# Patient Record
Sex: Female | Born: 1990 | Race: White | Hispanic: No | Marital: Single | Smoking: Never smoker
Health system: Southern US, Community
[De-identification: ages and names within clinical notes are randomized; demographics above are authoritative.]

## PROBLEM LIST (undated history)

## (undated) DIAGNOSIS — D6851 Activated protein C resistance: Secondary | ICD-10-CM

## (undated) HISTORY — DX: Activated protein C resistance: D68.51

## (undated) HISTORY — PX: EYE SURGERY: SHX253

---
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
## 2020-03-02 ENCOUNTER — Encounter: Payer: Self-pay | Admitting: Obstetrics

## 2020-03-02 LAB — OB RESULTS CONSOLE ANTIBODY SCREEN: Antibody Screen: NEGATIVE

## 2020-03-02 LAB — OB RESULTS CONSOLE RUBELLA ANTIBODY, IGM: Rubella: NON-IMMUNE/NOT IMMUNE

## 2020-03-02 LAB — OB RESULTS CONSOLE ABO/RH: RH Type: POSITIVE

## 2020-03-02 LAB — OB RESULTS CONSOLE HIV ANTIBODY (ROUTINE TESTING): HIV: NONREACTIVE

## 2020-03-02 LAB — OB RESULTS CONSOLE GC/CHLAMYDIA
Chlamydia: NEGATIVE
Gonorrhea: NEGATIVE

## 2020-03-02 LAB — OB RESULTS CONSOLE HEPATITIS B SURFACE ANTIGEN: Hepatitis B Surface Ag: NEGATIVE

## 2020-03-02 LAB — OB RESULTS CONSOLE RPR: RPR: NONREACTIVE

## 2020-05-11 ENCOUNTER — Other Ambulatory Visit: Payer: Self-pay | Admitting: Obstetrics and Gynecology

## 2020-05-11 DIAGNOSIS — Z363 Encounter for antenatal screening for malformations: Secondary | ICD-10-CM

## 2020-06-02 ENCOUNTER — Encounter: Payer: Self-pay | Admitting: *Deleted

## 2020-06-04 ENCOUNTER — Ambulatory Visit: Payer: 59 | Attending: Obstetrics and Gynecology

## 2020-06-04 ENCOUNTER — Encounter: Payer: Self-pay | Admitting: *Deleted

## 2020-06-04 ENCOUNTER — Ambulatory Visit: Payer: 59 | Admitting: *Deleted

## 2020-06-04 ENCOUNTER — Other Ambulatory Visit: Payer: Self-pay

## 2020-06-04 VITALS — BP 133/81 | HR 97 | Ht 70.0 in

## 2020-06-04 DIAGNOSIS — O43192 Other malformation of placenta, second trimester: Secondary | ICD-10-CM

## 2020-06-04 DIAGNOSIS — Z363 Encounter for antenatal screening for malformations: Secondary | ICD-10-CM

## 2020-06-05 ENCOUNTER — Other Ambulatory Visit: Payer: Self-pay | Admitting: *Deleted

## 2020-06-05 DIAGNOSIS — O43199 Other malformation of placenta, unspecified trimester: Secondary | ICD-10-CM

## 2020-06-08 DIAGNOSIS — U071 COVID-19: Secondary | ICD-10-CM | POA: Insufficient documentation

## 2020-07-16 ENCOUNTER — Ambulatory Visit: Payer: 59

## 2020-09-08 LAB — OB RESULTS CONSOLE GBS: GBS: NEGATIVE

## 2020-09-24 ENCOUNTER — Encounter (HOSPITAL_COMMUNITY): Payer: Self-pay | Admitting: *Deleted

## 2020-09-24 ENCOUNTER — Telehealth (HOSPITAL_COMMUNITY): Payer: Self-pay | Admitting: *Deleted

## 2020-09-24 NOTE — Telephone Encounter (Signed)
Preadmission screen  

## 2020-09-28 ENCOUNTER — Encounter (HOSPITAL_COMMUNITY): Payer: Self-pay | Admitting: *Deleted

## 2020-09-28 ENCOUNTER — Telehealth (HOSPITAL_COMMUNITY): Payer: Self-pay | Admitting: *Deleted

## 2020-09-28 NOTE — Telephone Encounter (Signed)
Preadmission screen  

## 2020-10-05 ENCOUNTER — Other Ambulatory Visit (HOSPITAL_COMMUNITY): Payer: 59 | Attending: Obstetrics and Gynecology

## 2020-10-05 ENCOUNTER — Other Ambulatory Visit: Payer: Self-pay | Admitting: Obstetrics and Gynecology

## 2020-10-05 NOTE — H&P (View-Only) (Signed)
Shelley Walker is a 30 y.o. female G2P0010 at 40 2/7 weeks (EDD 10/04/20 by LMP c/w 8 week US) presenting for IOL at term. Prenatal care significant for:  1)  Fetal Pelvic kidney   EFW 55% at 34 weeks, normal AFI  Right pelvic kidney of fetus and marginal  cord insertion--MFM US    Recommend postnatal f/u of kidney 2) COVID-19   sx started 01/17, + 01/20 3) Rubella non-immune   offer vaccine pp 4) Heterozygous Factor V Leiden mutation   No clotting events  Baby ASA daily  OB History    Gravida  2   Para      Term      Preterm      AB  1   Living        SAB  1   IAB      Ectopic      Multiple      Live Births            SAB x 1  Past Medical History:  Diagnosis Date  . Factor V Leiden (HCC)    Past Surgical History:  Procedure Laterality Date  . EYE SURGERY     age 1  . WISDOM TOOTH EXTRACTION  2010   Family History: family history is not on file. Social History:  reports that she has never smoked. She has never used smokeless tobacco. She reports that she does not drink alcohol and does not use drugs.     Maternal Diabetes: No Genetic Screening: Declined Maternal Ultrasounds/Referrals: Fetal Kidney Anomalies Right pelvic kidney Fetal Ultrasounds or other Referrals:  None Maternal Substance Abuse:  No Significant Maternal Medications:  None Significant Maternal Lab Results:  Group B Strep negative Other Comments:  None  Review of Systems  Constitutional: Negative for fever.  Genitourinary: Negative for pelvic pain and vaginal bleeding.   Maternal Medical History:  Contractions: Frequency: irregular.   Perceived severity is mild.    Fetal activity: Perceived fetal activity is normal.    Prenatal complications: Fetal pelvic kidney, heterozygous factor 5 leiden mutation  Prenatal Complications - Diabetes: none.      Last menstrual period 12/29/2019. Maternal Exam:  Uterine Assessment: Contraction strength is mild.  Contraction  frequency is irregular.   Abdomen: Patient reports no abdominal tenderness. Fetal presentation: vertex  Introitus: Normal vulva. Normal vagina.  Pelvis: adequate for delivery.      Physical Exam Genitourinary:    General: Normal vulva.  Neurological:     Mental Status: She is alert.     Prenatal labs: ABO, Rh: O/Positive/-- (10/11 0000) Antibody: Negative (10/11 0000) Rubella: Nonimmune (10/11 0000) RPR: Nonreactive (10/11 0000)  HBsAg: Negative (10/11 0000)  HIV: Non-reactive (10/11 0000)  GBS:     Assessment/Plan: Pt had OP foley placed with 35mL saline in balloon.  Tolerated well.  Plan pitocin and AROM when able.   Shelley Walker W Shelley Walker 10/05/2020, 8:28 PM    

## 2020-10-05 NOTE — H&P (Signed)
Shelley Walker is a 30 y.o. female G2P0010 at 71 2/7 weeks (EDD 10/04/20 by LMP c/w 8 week [place]) presenting for IOL at term. Prenatal care significant for:  1)  Fetal Pelvic kidney   EFW 55% at 34 weeks, normal AFI  Right pelvic kidney of fetus and marginal  cord insertion--MFM [place]    Recommend postnatal f/u of kidney 2) COVID-19   sx started 01/17, + 01/20 3) Rubella non-immune   offer vaccine pp 4) Heterozygous Factor V Leiden mutation   No clotting events  Baby ASA daily  OB History    Gravida  2   Para      Term      Preterm      AB  1   Living        SAB  1   IAB      Ectopic      Multiple      Live Births            SAB x 1  Past Medical History:  Diagnosis Date  . Factor V Leiden [facility])    Past Surgical History:  Procedure Laterality Date  . EYE SURGERY     age 12  . WISDOM TOOTH EXTRACTION  2010   Family History: family history is not on file. Social History:  reports that she has never smoked. She has never used smokeless tobacco. She reports that she does not drink alcohol and does not use drugs.     Maternal Diabetes: No Genetic Screening: Declined Maternal Ultrasounds/Referrals: Fetal Kidney Anomalies Right pelvic kidney Fetal Ultrasounds or other Referrals:  None Maternal Substance Abuse:  No Significant Maternal Medications:  None Significant Maternal Lab Results:  Group B Strep negative Other Comments:  None  Review of Systems  Constitutional: Negative for fever.  Genitourinary: Negative for pelvic pain and vaginal bleeding.   Maternal Medical History:  Contractions: Frequency: irregular.   Perceived severity is mild.    Fetal activity: Perceived fetal activity is normal.    Prenatal complications: Fetal pelvic kidney, heterozygous factor 5 leiden mutation  Prenatal Complications - Diabetes: none.      Last menstrual period 12/29/2019. Maternal Exam:  Uterine Assessment: Contraction strength is mild.  Contraction  frequency is irregular.   Abdomen: Patient reports no abdominal tenderness. Fetal presentation: vertex  Introitus: Normal vulva. Normal vagina.  Pelvis: adequate for delivery.      Physical Exam Genitourinary:    General: Normal vulva.  Neurological:     Mental Status: She is alert.     Prenatal labs: ABO, Rh: O/Positive/-- (10/11 0000) Antibody: Negative (10/11 0000) Rubella: Nonimmune (10/11 0000) RPR: Nonreactive (10/11 0000)  HBsAg: Negative (10/11 0000)  HIV: Non-reactive (10/11 0000)  GBS:     Assessment/Plan: Pt had OP foley placed with 66mL saline in balloon.  Tolerated well.  Plan pitocin and AROM when able.   Oliver Pila 10/05/2020, 8:28 PM

## 2020-10-06 ENCOUNTER — Inpatient Hospital Stay (EMERGENCY_DEPARTMENT_HOSPITAL)
Admission: AD | Admit: 2020-10-06 | Discharge: 2020-10-06 | Disposition: A | Discharge status: Home / Self Care | Payer: 59 | Source: Home / Self Care | Attending: Obstetrics and Gynecology | Admitting: Obstetrics and Gynecology

## 2020-10-06 ENCOUNTER — Encounter (HOSPITAL_COMMUNITY): Payer: Self-pay | Admitting: Obstetrics and Gynecology

## 2020-10-06 ENCOUNTER — Inpatient Hospital Stay (HOSPITAL_COMMUNITY)
Admission: AD | Admit: 2020-10-06 | Discharge: 2020-10-11 | DRG: 787 | Disposition: A | Discharge status: Home / Self Care | Payer: 59 | Attending: Obstetrics and Gynecology | Admitting: Obstetrics and Gynecology

## 2020-10-06 ENCOUNTER — Inpatient Hospital Stay (HOSPITAL_COMMUNITY): Admission: AD | Admit: 2020-10-06 | Payer: 59 | Source: Home / Self Care | Admitting: Obstetrics and Gynecology

## 2020-10-06 ENCOUNTER — Inpatient Hospital Stay (HOSPITAL_COMMUNITY): Payer: 59 | Admitting: Anesthesiology

## 2020-10-06 ENCOUNTER — Other Ambulatory Visit: Payer: Self-pay

## 2020-10-06 ENCOUNTER — Inpatient Hospital Stay (HOSPITAL_COMMUNITY): Payer: 59

## 2020-10-06 DIAGNOSIS — O9912 Other diseases of the blood and blood-forming organs and certain disorders involving the immune mechanism complicating childbirth: Secondary | ICD-10-CM | POA: Diagnosis present

## 2020-10-06 DIAGNOSIS — Z98891 History of uterine scar from previous surgery: Secondary | ICD-10-CM

## 2020-10-06 DIAGNOSIS — Z20822 Contact with and (suspected) exposure to covid-19: Secondary | ICD-10-CM | POA: Insufficient documentation

## 2020-10-06 DIAGNOSIS — D6851 Activated protein C resistance: Secondary | ICD-10-CM | POA: Diagnosis present

## 2020-10-06 DIAGNOSIS — O43123 Velamentous insertion of umbilical cord, third trimester: Secondary | ICD-10-CM | POA: Diagnosis present

## 2020-10-06 DIAGNOSIS — Z3A40 40 weeks gestation of pregnancy: Secondary | ICD-10-CM

## 2020-10-06 DIAGNOSIS — O471 False labor at or after 37 completed weeks of gestation: Secondary | ICD-10-CM | POA: Diagnosis not present

## 2020-10-06 DIAGNOSIS — Z8616 Personal history of COVID-19: Secondary | ICD-10-CM | POA: Diagnosis not present

## 2020-10-06 DIAGNOSIS — Z3689 Encounter for other specified antenatal screening: Secondary | ICD-10-CM

## 2020-10-06 DIAGNOSIS — O3663X0 Maternal care for excessive fetal growth, third trimester, not applicable or unspecified: Secondary | ICD-10-CM | POA: Diagnosis present

## 2020-10-06 DIAGNOSIS — O26893 Other specified pregnancy related conditions, third trimester: Secondary | ICD-10-CM | POA: Diagnosis present

## 2020-10-06 DIAGNOSIS — O480 Post-term pregnancy: Secondary | ICD-10-CM | POA: Insufficient documentation

## 2020-10-06 DIAGNOSIS — O358XX0 Maternal care for other (suspected) fetal abnormality and damage, not applicable or unspecified: Secondary | ICD-10-CM | POA: Diagnosis present

## 2020-10-06 DIAGNOSIS — Z23 Encounter for immunization: Secondary | ICD-10-CM | POA: Diagnosis not present

## 2020-10-06 LAB — CBC
HCT: 35.2 % — ABNORMAL LOW (ref 36.0–46.0)
Hemoglobin: 12 g/dL (ref 12.0–15.0)
MCH: 31.3 pg (ref 26.0–34.0)
MCHC: 34.1 g/dL (ref 30.0–36.0)
MCV: 91.7 fL (ref 80.0–100.0)
Platelets: 202 10*3/uL (ref 150–400)
RBC: 3.84 MIL/uL — ABNORMAL LOW (ref 3.87–5.11)
RDW: 13 % (ref 11.5–15.5)
WBC: 15.3 10*3/uL — ABNORMAL HIGH (ref 4.0–10.5)
nRBC: 0 % (ref 0.0–0.2)

## 2020-10-06 LAB — TYPE AND SCREEN
ABO/RH(D): O POS
Antibody Screen: NEGATIVE

## 2020-10-06 LAB — RESP PANEL BY RT-PCR (FLU A&B, COVID) ARPGX2
Influenza A by PCR: NEGATIVE
Influenza B by PCR: NEGATIVE
SARS Coronavirus 2 by RT PCR: NEGATIVE

## 2020-10-06 MED ORDER — OXYTOCIN BOLUS FROM INFUSION: 333.0000 mL | Freq: Once | INTRAVENOUS | Status: DC

## 2020-10-06 MED ORDER — LACTATED RINGERS IV SOLN: 500.0000 mL | Freq: Once | INTRAVENOUS | Status: DC

## 2020-10-06 MED ORDER — ACETAMINOPHEN 325 MG PO TABS: 650.0000 mg | ORAL_TABLET | ORAL | Status: DC | PRN

## 2020-10-06 MED ORDER — LIDOCAINE HCL (PF) 1 % IJ SOLN: 30.0000 mL | INTRAMUSCULAR | Status: DC | PRN

## 2020-10-06 MED ORDER — TERBUTALINE SULFATE 1 MG/ML IJ SOLN: 0.2500 mg | Freq: Once | INTRAMUSCULAR | Status: DC | PRN

## 2020-10-06 MED ORDER — ONDANSETRON HCL 4 MG/2ML IJ SOLN
4.0000 mg | Freq: Four times a day (QID) | INTRAMUSCULAR | Status: DC | PRN
  Administered 2020-10-07 – 2020-10-08 (×3): 4 mg via INTRAVENOUS
  Filled 2020-10-06 (×2): qty 2

## 2020-10-06 MED ORDER — SOD CITRATE-CITRIC ACID 500-334 MG/5ML PO SOLN
30.0000 mL | ORAL | Status: DC | PRN
  Administered 2020-10-08: 30 mL via ORAL
  Filled 2020-10-06: qty 15

## 2020-10-06 MED ORDER — LACTATED RINGERS IV SOLN
500.0000 mL | Freq: Once | INTRAVENOUS | Status: AC
  Administered 2020-10-07: 500 mL via INTRAVENOUS

## 2020-10-06 MED ORDER — OXYCODONE-ACETAMINOPHEN 5-325 MG PO TABS: 2.0000 | ORAL_TABLET | ORAL | Status: DC | PRN

## 2020-10-06 MED ORDER — DIPHENHYDRAMINE HCL 50 MG/ML IJ SOLN
12.5000 mg | INTRAMUSCULAR | Status: DC | PRN
Start: 2020-10-06 — End: 2020-10-08
  Administered 2020-10-07: 12.5 mg via INTRAVENOUS
  Filled 2020-10-06: qty 1

## 2020-10-06 MED ORDER — EPHEDRINE 5 MG/ML INJ: 10.0000 mg | INTRAVENOUS | Status: DC | PRN

## 2020-10-06 MED ORDER — PHENYLEPHRINE 40 MCG/ML (10ML) SYRINGE FOR IV PUSH (FOR BLOOD PRESSURE SUPPORT): 80.0000 ug | PREFILLED_SYRINGE | INTRAVENOUS | Status: DC | PRN

## 2020-10-06 MED ORDER — LACTATED RINGERS IV SOLN: 500.0000 mL | INTRAVENOUS | Status: DC | PRN

## 2020-10-06 MED ORDER — OXYTOCIN-SODIUM CHLORIDE 30-0.9 UT/500ML-% IV SOLN
1.0000 m[IU]/min | INTRAVENOUS | Status: DC
  Administered 2020-10-06: 2 m[IU]/min via INTRAVENOUS
  Filled 2020-10-06: qty 500

## 2020-10-06 MED ORDER — FENTANYL-BUPIVACAINE-NACL 0.5-0.125-0.9 MG/250ML-% EP SOLN
12.0000 mL/h | EPIDURAL | Status: DC | PRN
Start: 2020-10-06 — End: 2020-10-08
  Administered 2020-10-06: 12 mL/h via EPIDURAL
  Filled 2020-10-06 (×2): qty 250

## 2020-10-06 MED ORDER — OXYCODONE-ACETAMINOPHEN 5-325 MG PO TABS: 1.0000 | ORAL_TABLET | ORAL | Status: DC | PRN

## 2020-10-06 MED ORDER — BUTORPHANOL TARTRATE 1 MG/ML IJ SOLN
1.0000 mg | INTRAMUSCULAR | Status: DC | PRN
Start: 2020-10-06 — End: 2020-10-08

## 2020-10-06 MED ORDER — LACTATED RINGERS IV SOLN: INTRAVENOUS | Status: DC

## 2020-10-06 MED ORDER — OXYTOCIN-SODIUM CHLORIDE 30-0.9 UT/500ML-% IV SOLN: 2.5000 [IU]/h | INTRAVENOUS | Status: DC

## 2020-10-06 NOTE — Interval H&P Note (Signed)
History and Physical Interval Note:  10/06/2020 7:11 PM As above, pt presenting after delay for IOL.  FHR in MAU category 1  Physical Exam Heart RRR Lungs CTA bilaterally Abdomen gravid NT Cervix 80/2/-2 in office Ext with no edema  Plan pitocin and AROM Epidural when desired   Oliver Pila

## 2020-10-06 NOTE — MAU Note (Signed)
.   Shelley Walker is a 30 y.o. at [redacted]w[redacted]d here in MAU reporting: ctx that started around 1930. Denies VB or LOF. Had foley bulb placed at 0415. Endorses good fetal movement. GBS neg  Pain score: 5 Vitals:   10/06/20 0235  BP: (!) 139/91  Resp: 15  Temp: 98.5 F (36.9 C)  SpO2: 94%     FHT:128

## 2020-10-06 NOTE — Anesthesia Preprocedure Evaluation (Addendum)
Anesthesia Evaluation  Patient identified by MRN, date of birth, ID band Patient awake    Reviewed: Allergy & Precautions, Patient's Chart, lab work & pertinent test results  History of Anesthesia Complications Negative for: history of anesthetic complications  Airway Mallampati: II  TM Distance: >3 FB Neck ROM: Full    Dental no notable dental hx.    Pulmonary neg pulmonary ROS,    Pulmonary exam normal        Cardiovascular negative cardio ROS Normal cardiovascular exam     Neuro/Psych negative neurological ROS  negative psych ROS   GI/Hepatic negative GI ROS, Neg liver ROS,   Endo/Other  negative endocrine ROS  Renal/GU negative Renal ROS  negative genitourinary   Musculoskeletal negative musculoskeletal ROS (+)   Abdominal   Peds  Hematology Factor V Leiden   Anesthesia Other Findings Day of surgery medications reviewed with patient.  Reproductive/Obstetrics (+) Pregnancy                             Anesthesia Physical Anesthesia Plan  ASA: II  Anesthesia Plan: Epidural   Post-op Pain Management:    Induction:   PONV Risk Score and Plan: Treatment may vary due to age or medical condition  Airway Management Planned: Natural Airway  Additional Equipment:   Intra-op Plan:   Post-operative Plan:   Informed Consent: I have reviewed the patients History and Physical, chart, labs and discussed the procedure including the risks, benefits and alternatives for the proposed anesthesia with the patient or authorized representative who has indicated his/her understanding and acceptance.       Plan Discussed with: CRNA and Anesthesiologist  Anesthesia Plan Comments: (C section called by Dr. Mindi Slicker for arrest of dilation. Plan to dose epidural for surgical anesthesia. GETA as backup plan. Tanna Furry, MD  )       Anesthesia Quick Evaluation

## 2020-10-06 NOTE — MAU Provider Note (Signed)
Pt with delay of IOL due to full L&D  Brought into office this PM to remove foley which had been in 24 hours.  SHe reports no major contractions or VB but has noted decreased FM much of day  NST in office had good variability and no decels, some accels but did not meet criteria for reactive so sent to MAU for continued monitoring and hopefully will be able to be admitted to L&D in next few hours.  Cervix 2+/80/-2 S/p foley removal  When able to be admitted plan pitocin and AROM. Will follow FHR

## 2020-10-06 NOTE — MAU Provider Note (Signed)
S: Ms. Shelley Walker is a 30 y.o. G2P0010 at [redacted]w[redacted]d  who presents to MAU today complaining contractions q 3-5 minutes since 1930. She denies vaginal bleeding. She denies LOF. She reports normal fetal movement.    O: BP 129/75 (BP Location: Right Arm)   Pulse 64   Temp 98.5 F (36.9 C) (Oral)   Resp 16   LMP 12/29/2019   SpO2 99%  GENERAL: Well-developed, well-nourished female in no acute distress.  HEAD: Normocephalic, atraumatic.  CHEST: Normal effort of breathing, regular heart rate ABDOMEN: Soft, nontender, gravid  Cervical exam:  Dilation: 2 Effacement (%): 70 Cervical Position: Middle Station: -3 Presentation: Undeterminable (foley bulb in place) Exam by:: Holly Flippin RN  Fetal Monitoring: reactive Baseline: 125 Variability: moderate Accelerations: 15x15 Decelerations: none Contractions: q3-5   A: SIUP at [redacted]w[redacted]d  Latent labor  P: Discharge home in stable condition with term labor precautions - return to MAU if FB is out with close contractions or ROM, otherwise return to L&D when called in for IOL  Bernerd Limbo, CNM 10/06/2020 4:18 AM

## 2020-10-06 NOTE — Discharge Instructions (Signed)
Return to MAU if your foley bulb falls out and contractions are 4-80min apart lasting long or your water breaks. Otherwise wait for L&D to call you in for your induction.

## 2020-10-06 NOTE — MAU Note (Signed)
Shelley Walker is a 30 y.o. at [redacted]w[redacted]d here in MAU reporting: sent over from the office for DFM. Was in the office for removal of foley bulb. States irregular contractions since MAU visit last night. No LOF. Having some spotting.  Onset of complaint: ongoing  Pain score: 0/10

## 2020-10-07 LAB — RPR: RPR Ser Ql: NONREACTIVE

## 2020-10-07 MED ORDER — DIPHENHYDRAMINE HCL 50 MG/ML IJ SOLN
25.0000 mg | Freq: Once | INTRAMUSCULAR | Status: AC
  Administered 2020-10-07: 25 mg via INTRAVENOUS

## 2020-10-07 MED ORDER — MORPHINE SULFATE (PF) 0.5 MG/ML IJ SOLN
INTRAMUSCULAR | Status: AC
  Filled 2020-10-07: qty 10

## 2020-10-07 MED ORDER — FENTANYL CITRATE (PF) 100 MCG/2ML IJ SOLN
INTRAMUSCULAR | Status: AC
  Filled 2020-10-07: qty 2

## 2020-10-07 NOTE — Progress Notes (Signed)
Patient ID: Shelley Walker, female   DOB: [birth year], 30 y.o.   MRN: [id number] Pt feeling a bit of pressure with contractions  afeb VSS FHR category 1 with intermittent mild variable decels  80/4+/-1  Latent phase labor, continue to follow progress

## 2020-10-07 NOTE — Progress Notes (Signed)
Nigel Wessman is a 30 y.o. G2P0010 at [redacted]w[redacted]d comfortable in labor. She reports no complaints.   Objective: BP (!) 102/55   Pulse 63   Temp 98.6 F (37 C) (Oral)   Resp 17   Ht 5\' 10"  (1.778 m)   Wt 86.2 kg   LMP 12/29/2019   SpO2 96%   BMI 27.26 kg/m  No intake/output data recorded. Total I/O In: -  Out: 600 [Urine:600]  FHT:  FHR: 145 bpm, variability: moderate,  accelerations:  Present,  decelerations:  Absent UC:   regular, every 2-3 minutes SVE:   Dilation: 6.5 Effacement (%): 90 Station: -1 Exam by:: Bronc Brosseau, DO  Labs: Lab Results  Component Value Date   WBC 15.3 (H) 10/06/2020   HGB 12.0 10/06/2020   HCT 35.2 (L) 10/06/2020   MCV 91.7 10/06/2020   PLT 202 10/06/2020    Assessment / Plan: Augmentation of labor, progressing well  Labor: progressing well on 10/08/2020 of pitocin Preeclampsia:  n/a Fetal Wellbeing:  Category I Pain Control:  Epidural I/D:  n/a Anticipated MOD:  NSVD  Ritta Hammes 10/07/2020, 10:19 AM

## 2020-10-07 NOTE — Anesthesia Procedure Notes (Addendum)
Epidural Patient location during procedure: OB Start time: 10/06/2020 11:53 PM End time: 10/06/2020 11:56 PM  Staffing Anesthesiologist: Kaylyn Layer, MD Performed: anesthesiologist   Preanesthetic Checklist Completed: patient identified, IV checked, risks and benefits discussed, monitors and equipment checked, pre-op evaluation and timeout performed  Epidural Patient position: sitting Prep: DuraPrep and site prepped and draped Patient monitoring: continuous pulse ox, blood pressure and heart rate Approach: midline Location: L3-L4 Injection technique: LOR air  Needle:  Needle type: Tuohy  Needle gauge: 17 G Needle length: 9 cm Needle insertion depth: 5 cm Catheter type: closed end flexible Catheter size: 19 Gauge Catheter at skin depth: 10 cm Test dose: negative and Other (1% lidocaine)  Assessment Events: blood not aspirated, injection not painful, no injection resistance, no paresthesia and negative IV test  Additional Notes Patient identified. Risks, benefits, and alternatives discussed with patient including but not limited to bleeding, infection, nerve damage, paralysis, failed block, incomplete pain control, headache, blood pressure changes, nausea, vomiting, reactions to medication, itching, and postpartum back pain. Confirmed with bedside nurse the patient's most recent platelet count. Confirmed with patient that they are not currently taking any anticoagulation, have any bleeding history, or any family history of bleeding disorders. Patient expressed understanding and wished to proceed. All questions were answered. Sterile technique was used throughout the entire procedure. Please see nursing notes for vital signs.   Crisp LOR on first pass. Test dose was given through epidural catheter and negative prior to continuing to dose epidural or start infusion. Warning signs of high block given to the patient including shortness of breath, tingling/numbness in hands, complete  motor block, or any concerning symptoms with instructions to call for help. Patient was given instructions on fall risk and not to get out of bed. All questions and concerns addressed with instructions to call with any issues or inadequate analgesia.  Reason for block:procedure for pain

## 2020-10-07 NOTE — Progress Notes (Signed)
Patient ID: Shelley Walker, female   DOB: [birth year], 30 y.o.   MRN: [id number]  Pt reports feels some pressure with contractions.  Pt had been on of pitocin however her contractions begun to space out.  I had pitocin switched off for an hour then restarted it and adequate mvus were noted.  At this time, I checked cervix and noted still has some swelling of cervix at 11-1oclock and now 8.5cm.  Cat 1 strip noted  Pt and family reported they were not encouraged by minimal progress over past few hours.  I discussed risks/benefits of continued labor vs cesarean section and they opt for the latter  Pitocin stopped and OR staff notified  Pt to go to OR when ready Consent signed

## 2020-10-07 NOTE — Progress Notes (Signed)
Patient ID: Shelley Walker, female   DOB: 1990/10/27, 30 y.o.   MRN: [id number] Pt got uncomfortable and received epidural at 12 am, comfortable now  afeb VSS FHR with occasional variable decelerations, one prolonged but overall category 1  80/3-4/-1  IUPC and FSE placed to titrate pitocin, follow FHR At 12 mu currently

## 2020-10-07 NOTE — Progress Notes (Signed)
Patient ID: Shelley Walker, female   DOB: Aug 18, 1990, 30 y.o.   MRN: [id number] Pt reports lower abdominal pressure worse when on her back VSS EFM - 145, cat 1 TOCO - ctxs q 3-52mins SVE - 8cm dilated -1; cervix swollen at 11-1oclock  A/P: IUP at 40 3/7wks on pitocin at 24u;           - S/P pitocin infusion x 22hrs          - recommended pitocin be stopped for the next for a break; plan restart at 

## 2020-10-08 ENCOUNTER — Encounter (HOSPITAL_COMMUNITY): Payer: Self-pay | Admitting: Obstetrics and Gynecology

## 2020-10-08 ENCOUNTER — Encounter (HOSPITAL_COMMUNITY)
Admission: AD | Disposition: A | Discharge status: Home / Self Care | Payer: Self-pay | Source: Home / Self Care | Attending: Obstetrics and Gynecology

## 2020-10-08 DIAGNOSIS — Z98891 History of uterine scar from previous surgery: Secondary | ICD-10-CM

## 2020-10-08 HISTORY — DX: History of uterine scar from previous surgery: Z98.891

## 2020-10-08 LAB — CBC
HCT: 27.8 % — ABNORMAL LOW (ref 36.0–46.0)
Hemoglobin: 9.5 g/dL — ABNORMAL LOW (ref 12.0–15.0)
MCH: 31.8 pg (ref 26.0–34.0)
MCHC: 34.2 g/dL (ref 30.0–36.0)
MCV: 93 fL (ref 80.0–100.0)
Platelets: 147 10*3/uL — ABNORMAL LOW (ref 150–400)
RBC: 2.99 MIL/uL — ABNORMAL LOW (ref 3.87–5.11)
RDW: 13 % (ref 11.5–15.5)
WBC: 23.6 10*3/uL — ABNORMAL HIGH (ref 4.0–10.5)
nRBC: 0 % (ref 0.0–0.2)

## 2020-10-08 LAB — CREATININE, SERUM
Creatinine, Ser: 1.5 mg/dL — ABNORMAL HIGH (ref 0.44–1.00)
GFR, Estimated: 48 mL/min — ABNORMAL LOW (ref >60)

## 2020-10-08 SURGERY — Surgical Case
Anesthesia: Epidural

## 2020-10-08 MED ORDER — MENTHOL 3 MG MT LOZG: 1.0000 | LOZENGE | OROMUCOSAL | Status: DC | PRN

## 2020-10-08 MED ORDER — MORPHINE SULFATE (PF) 0.5 MG/ML IJ SOLN
INTRAMUSCULAR | Status: AC
  Filled 2020-10-08: qty 10

## 2020-10-08 MED ORDER — KETOROLAC TROMETHAMINE 30 MG/ML IJ SOLN
30.0000 mg | Freq: Four times a day (QID) | INTRAMUSCULAR | Status: AC | PRN
  Administered 2020-10-08: 30 mg via INTRAVENOUS

## 2020-10-08 MED ORDER — SENNOSIDES-DOCUSATE SODIUM 8.6-50 MG PO TABS
2.0000 | ORAL_TABLET | Freq: Every day | ORAL | Status: DC
  Administered 2020-10-09 – 2020-10-11 (×3): 2 via ORAL
  Filled 2020-10-08 (×4): qty 2

## 2020-10-08 MED ORDER — MEPERIDINE HCL 25 MG/ML IJ SOLN: 6.2500 mg | INTRAMUSCULAR | Status: DC | PRN

## 2020-10-08 MED ORDER — TETANUS-DIPHTH-ACELL PERTUSSIS 5-2.5-18.5 LF-MCG/0.5 IM SUSY: 0.5000 mL | PREFILLED_SYRINGE | Freq: Once | INTRAMUSCULAR | Status: DC

## 2020-10-08 MED ORDER — DIPHENHYDRAMINE HCL 25 MG PO CAPS
25.0000 mg | ORAL_CAPSULE | Freq: Four times a day (QID) | ORAL | Status: DC | PRN
  Administered 2020-10-08: 25 mg via ORAL

## 2020-10-08 MED ORDER — SCOPOLAMINE 1 MG/3DAYS TD PT72
1.0000 | MEDICATED_PATCH | Freq: Once | TRANSDERMAL | Status: AC
  Administered 2020-10-08: 1.5 mg via TRANSDERMAL

## 2020-10-08 MED ORDER — LIDOCAINE-EPINEPHRINE (PF) 2 %-1:200000 IJ SOLN
INTRAMUSCULAR | Status: DC | PRN
  Administered 2020-10-08: 10 mL via EPIDURAL
  Administered 2020-10-08: 5 mL via EPIDURAL

## 2020-10-08 MED ORDER — KETOROLAC TROMETHAMINE 30 MG/ML IJ SOLN: 30.0000 mg | Freq: Once | INTRAMUSCULAR | Status: DC

## 2020-10-08 MED ORDER — SODIUM CHLORIDE 0.9 % IR SOLN
Status: DC | PRN
  Administered 2020-10-08 (×2): 1

## 2020-10-08 MED ORDER — ACETAMINOPHEN 500 MG PO TABS
1000.0000 mg | ORAL_TABLET | Freq: Four times a day (QID) | ORAL | Status: DC
  Administered 2020-10-08 – 2020-10-11 (×12): 1000 mg via ORAL
  Filled 2020-10-08 (×13): qty 2

## 2020-10-08 MED ORDER — OXYTOCIN-SODIUM CHLORIDE 30-0.9 UT/500ML-% IV SOLN: 2.5000 [IU]/h | INTRAVENOUS | Status: AC

## 2020-10-08 MED ORDER — NALBUPHINE HCL 10 MG/ML IJ SOLN: 5.0000 mg | Freq: Once | INTRAMUSCULAR | Status: DC | PRN

## 2020-10-08 MED ORDER — NALBUPHINE HCL 10 MG/ML IJ SOLN
5.0000 mg | INTRAMUSCULAR | Status: DC | PRN
Start: 2020-10-08 — End: 2020-10-11

## 2020-10-08 MED ORDER — ACETAMINOPHEN 500 MG PO TABS
1000.0000 mg | ORAL_TABLET | Freq: Four times a day (QID) | ORAL | Status: DC
  Administered 2020-10-08: 1000 mg via ORAL
  Filled 2020-10-08: qty 2

## 2020-10-08 MED ORDER — CEFAZOLIN SODIUM-DEXTROSE 2-3 GM-%(50ML) IV SOLR
INTRAVENOUS | Status: DC | PRN
  Administered 2020-10-08: 2 g via INTRAVENOUS

## 2020-10-08 MED ORDER — NALOXONE HCL 4 MG/10ML IJ SOLN
1.0000 ug/kg/h | INTRAVENOUS | Status: DC | PRN
  Filled 2020-10-08: qty 5

## 2020-10-08 MED ORDER — SODIUM CHLORIDE 0.9 % IV SOLN: INTRAVENOUS | Status: DC | PRN

## 2020-10-08 MED ORDER — DIPHENHYDRAMINE HCL 25 MG PO CAPS
25.0000 mg | ORAL_CAPSULE | ORAL | Status: DC | PRN
  Filled 2020-10-08: qty 1

## 2020-10-08 MED ORDER — OXYCODONE HCL 5 MG PO TABS: 5.0000 mg | ORAL_TABLET | Freq: Once | ORAL | Status: DC | PRN

## 2020-10-08 MED ORDER — WITCH HAZEL-GLYCERIN EX PADS: 1.0000 "application " | MEDICATED_PAD | CUTANEOUS | Status: DC | PRN

## 2020-10-08 MED ORDER — MORPHINE SULFATE (PF) 0.5 MG/ML IJ SOLN
INTRAMUSCULAR | Status: DC | PRN
  Administered 2020-10-08: 3 mg via EPIDURAL

## 2020-10-08 MED ORDER — FENTANYL CITRATE (PF) 100 MCG/2ML IJ SOLN: 25.0000 ug | INTRAMUSCULAR | Status: DC | PRN

## 2020-10-08 MED ORDER — LIDOCAINE HCL (PF) 1 % IJ SOLN
INTRAMUSCULAR | Status: DC | PRN
  Administered 2020-10-06 (×2): 4 mL via EPIDURAL

## 2020-10-08 MED ORDER — OXYCODONE HCL 5 MG PO TABS
5.0000 mg | ORAL_TABLET | ORAL | Status: DC | PRN
  Administered 2020-10-10 (×2): 10 mg via ORAL
  Administered 2020-10-10: 5 mg via ORAL
  Administered 2020-10-10 – 2020-10-11 (×3): 10 mg via ORAL
  Filled 2020-10-08 (×5): qty 2
  Filled 2020-10-08: qty 1

## 2020-10-08 MED ORDER — ACETAMINOPHEN 10 MG/ML IV SOLN: 1000.0000 mg | Freq: Once | INTRAVENOUS | Status: DC | PRN

## 2020-10-08 MED ORDER — NALBUPHINE HCL 10 MG/ML IJ SOLN: 5.0000 mg | INTRAMUSCULAR | Status: DC | PRN

## 2020-10-08 MED ORDER — OXYTOCIN-SODIUM CHLORIDE 30-0.9 UT/500ML-% IV SOLN
INTRAVENOUS | Status: DC | PRN
  Administered 2020-10-08: 30 [IU] via INTRAVENOUS

## 2020-10-08 MED ORDER — SIMETHICONE 80 MG PO CHEW: 80.0000 mg | CHEWABLE_TABLET | ORAL | Status: DC | PRN

## 2020-10-08 MED ORDER — DIPHENHYDRAMINE HCL 50 MG/ML IJ SOLN: 12.5000 mg | INTRAMUSCULAR | Status: DC | PRN

## 2020-10-08 MED ORDER — ZOLPIDEM TARTRATE 5 MG PO TABS: 5.0000 mg | ORAL_TABLET | Freq: Every evening | ORAL | Status: DC | PRN

## 2020-10-08 MED ORDER — ONDANSETRON HCL 4 MG/2ML IJ SOLN: 4.0000 mg | Freq: Three times a day (TID) | INTRAMUSCULAR | Status: DC | PRN

## 2020-10-08 MED ORDER — SIMETHICONE 80 MG PO CHEW
80.0000 mg | CHEWABLE_TABLET | Freq: Three times a day (TID) | ORAL | Status: DC
  Administered 2020-10-08 (×2): 80 mg via ORAL
  Filled 2020-10-08 (×2): qty 1

## 2020-10-08 MED ORDER — OXYCODONE HCL 5 MG/5ML PO SOLN: 5.0000 mg | Freq: Once | ORAL | Status: DC | PRN

## 2020-10-08 MED ORDER — NALOXONE HCL 0.4 MG/ML IJ SOLN: 0.4000 mg | INTRAMUSCULAR | Status: DC | PRN

## 2020-10-08 MED ORDER — IBUPROFEN 600 MG PO TABS
600.0000 mg | ORAL_TABLET | Freq: Four times a day (QID) | ORAL | Status: AC
  Administered 2020-10-08 – 2020-10-10 (×11): 600 mg via ORAL
  Filled 2020-10-08 (×11): qty 1

## 2020-10-08 MED ORDER — COCONUT OIL OIL: 1.0000 "application " | TOPICAL_OIL | Status: DC | PRN

## 2020-10-08 MED ORDER — PRENATAL MULTIVITAMIN CH
1.0000 | ORAL_TABLET | Freq: Every day | ORAL | Status: DC
  Administered 2020-10-08 – 2020-10-11 (×4): 1 via ORAL
  Filled 2020-10-08 (×4): qty 1

## 2020-10-08 MED ORDER — SODIUM CHLORIDE 0.9% FLUSH: 3.0000 mL | INTRAVENOUS | Status: DC | PRN

## 2020-10-08 MED ORDER — FENTANYL CITRATE (PF) 100 MCG/2ML IJ SOLN
INTRAMUSCULAR | Status: AC
  Filled 2020-10-08: qty 2

## 2020-10-08 MED ORDER — DIBUCAINE (PERIANAL) 1 % EX OINT
1.0000 "application " | TOPICAL_OINTMENT | CUTANEOUS | Status: DC | PRN
  Administered 2020-10-10: 1 via RECTAL
  Filled 2020-10-08: qty 28

## 2020-10-08 MED ORDER — PROMETHAZINE HCL 25 MG/ML IJ SOLN: 6.2500 mg | INTRAMUSCULAR | Status: DC | PRN

## 2020-10-08 MED ORDER — MEASLES, MUMPS & RUBELLA VAC IJ SOLR
0.5000 mL | Freq: Once | INTRAMUSCULAR | Status: AC
  Administered 2020-10-11: 0.5 mL via SUBCUTANEOUS
  Filled 2020-10-08: qty 0.5

## 2020-10-08 MED ORDER — KETOROLAC TROMETHAMINE 30 MG/ML IJ SOLN: 30.0000 mg | Freq: Four times a day (QID) | INTRAMUSCULAR | Status: AC | PRN

## 2020-10-08 MED ORDER — FENTANYL CITRATE (PF) 100 MCG/2ML IJ SOLN
INTRAMUSCULAR | Status: DC | PRN
  Administered 2020-10-08: 100 ug via EPIDURAL

## 2020-10-08 MED ORDER — ENOXAPARIN SODIUM 40 MG/0.4ML IJ SOSY
40.0000 mg | PREFILLED_SYRINGE | INTRAMUSCULAR | Status: DC
  Administered 2020-10-08 – 2020-10-10 (×3): 40 mg via SUBCUTANEOUS
  Filled 2020-10-08 (×3): qty 0.4

## 2020-10-08 SURGICAL SUPPLY — 37 items
BENZOIN TINCTURE PRP APPL 2/3 (GAUZE/BANDAGES/DRESSINGS) ×2 IMPLANT
CHLORAPREP W/TINT 26ML (MISCELLANEOUS) ×2 IMPLANT
CLAMP CORD UMBIL (MISCELLANEOUS) IMPLANT
CLOSURE STERI STRIP 1/2 X4 (GAUZE/BANDAGES/DRESSINGS) ×2 IMPLANT
CLOTH BEACON ORANGE TIMEOUT ST (SAFETY) ×2 IMPLANT
DRAPE C SECTION CLR SCREEN (DRAPES) ×2 IMPLANT
DRSG OPSITE POSTOP 4X10 (GAUZE/BANDAGES/DRESSINGS) ×2 IMPLANT
ELECT REM PT RETURN 9FT ADLT (ELECTROSURGICAL) ×2
ELECTRODE REM PT RTRN 9FT ADLT (ELECTROSURGICAL) ×1 IMPLANT
EXTRACTOR VACUUM KIWI (MISCELLANEOUS) IMPLANT
GLOVE BIO SURGEON STRL SZ 6.5 (GLOVE) ×2 IMPLANT
GLOVE BIOGEL PI IND STRL 7.0 (GLOVE) ×2 IMPLANT
GLOVE BIOGEL PI INDICATOR 7.0 (GLOVE) ×2
GOWN STRL REUS W/TWL LRG LVL3 (GOWN DISPOSABLE) ×4 IMPLANT
KIT ABG SYR 3ML LUER SLIP (SYRINGE) IMPLANT
NEEDLE HYPO 25X5/8 SAFETYGLIDE (NEEDLE) IMPLANT
NS IRRIG 1000ML POUR BTL (IV SOLUTION) ×2 IMPLANT
PACK C SECTION WH (CUSTOM PROCEDURE TRAY) ×2 IMPLANT
PAD OB MATERNITY 4.3X12.25 (PERSONAL CARE ITEMS) ×2 IMPLANT
RETRACTOR WND ALEXIS 25 LRG (MISCELLANEOUS) ×1 IMPLANT
RTRCTR C-SECT PINK 25CM LRG (MISCELLANEOUS) IMPLANT
RTRCTR WOUND ALEXIS 25CM LRG (MISCELLANEOUS) ×2
STRIP CLOSURE SKIN 1/2X4 (GAUZE/BANDAGES/DRESSINGS) IMPLANT
SUT CHROMIC 1 CTX 36 (SUTURE) ×4 IMPLANT
SUT PLAIN 0 NONE (SUTURE) IMPLANT
SUT PLAIN 2 0 XLH (SUTURE) ×2 IMPLANT
SUT VIC AB 0 CT1 27 (SUTURE) ×2
SUT VIC AB 0 CT1 27XBRD ANBCTR (SUTURE) ×2 IMPLANT
SUT VIC AB 0 CT1 36 (SUTURE) ×2 IMPLANT
SUT VIC AB 2-0 CT1 27 (SUTURE) ×1
SUT VIC AB 2-0 CT1 TAPERPNT 27 (SUTURE) ×1 IMPLANT
SUT VIC AB 3-0 CT1 27 (SUTURE) ×1
SUT VIC AB 3-0 CT1 TAPERPNT 27 (SUTURE) ×1 IMPLANT
SUT VIC AB 4-0 KS 27 (SUTURE) ×2 IMPLANT
TOWEL OR 17X24 6PK STRL BLUE (TOWEL DISPOSABLE) ×2 IMPLANT
TRAY FOLEY W/BAG SLVR 14FR LF (SET/KITS/TRAYS/PACK) ×2 IMPLANT
WATER STERILE IRR 1000ML POUR (IV SOLUTION) ×4 IMPLANT

## 2020-10-08 NOTE — Anesthesia Postprocedure Evaluation (Signed)
Anesthesia Post Note  Patient: Shelley Walker  Procedure(s) Performed: CESAREAN SECTION (N/A )     Patient location during evaluation: PACU Anesthesia Type: Epidural Level of consciousness: awake and alert Pain management: pain level controlled Vital Signs Assessment: post-procedure vital signs reviewed and stable Respiratory status: spontaneous breathing, nonlabored ventilation and respiratory function stable Cardiovascular status: stable Postop Assessment: no headache, no backache and epidural receding Anesthetic complications: no   No complications documented.  Last Vitals:  Vitals:   10/08/20 0415 10/08/20 0534  BP: 116/70 115/69  Pulse: 65 (!) 56  Resp: 16 18  Temp: 37.2 C 37.1 C  SpO2:      Last Pain:  Vitals:   10/08/20 0534  TempSrc: Oral  PainSc:    Pain Goal:                Epidural/Spinal Function Cutaneous sensation: Normal sensation (10/08/20 0532), Patient able to flex knees: Yes (10/08/20 0532), Patient able to lift hips off bed: Yes (10/08/20 0532), Back pain beyond tenderness at insertion site: No (10/08/20 0532), Progressively worsening motor and/or sensory loss: No (10/08/20 0532), Bowel and/or bladder incontinence post epidural: No (10/08/20 0532)  Mellody Dance

## 2020-10-08 NOTE — Lactation Note (Signed)
This note was copied from a baby's chart. Lactation Consultation Note  Patient Name: Shelley Walker Date: 10/08/2020 Reason for consult: Initial assessment Age:30 Hours   P1, Infant Term, LC brochure given and basic teaching done .  Mother reports that infant has had two feedings. She reports that the last one was on and off for 30 mins.  Mother taught to hand express colostrum. Observed large drops from the Rt breast.  Mother has LGT. She also has wide space between her breast. I didn't ask about breast changes during pregnancy.  Infant took a few shallow sucks, no sustained latch.  Mother to continue to cue base feed infant and feed at least 8-12 times or more in 24 hours and advised to allow for cluster feeding infant as needed.  Mother to continue to due STS. Mother is aware of available LC services at [facility], BFSG'S, OP Dept, and phone # for questions or concerns about breastfeeding.  Mother receptive to all teaching and plan of care.    Maternal Data Has patient been taught Hand Expression?: Yes Does the patient have breastfeeding experience prior to this delivery?: No  Feeding Mother's Current Feeding Choice: Breast Milk  LATCH Score Latch: Repeated attempts needed to sustain latch, nipple held in mouth throughout feeding, stimulation needed to elicit sucking reflex.  Audible Swallowing: None  Type of Nipple: Everted at rest and after stimulation  Comfort (Breast/Nipple): Soft / non-tender  Hold (Positioning): Assistance needed to correctly position infant at breast and maintain latch.  LATCH Score: 6   Lactation Tools Discussed/Used    Interventions Interventions: Hand express;Skin to skin;Assisted with latch;Breast feeding basics reviewed;Breast compression;Adjust position;Support pillows;Position options  Discharge Pump: Personal  Consult Status Consult Status: Follow-up Date: 10/08/20 Follow-up type: In-patient    Stevan Born  [facility] 10/08/2020, 12:58 PM

## 2020-10-08 NOTE — Progress Notes (Signed)
POSTPARTUM POSTOP PROGRESS NOTE  POD #1  Subjective:  No acute events overnight.  Pt denies problems with po intake.  She denies nausea or vomiting.  Pain is well controlled.  She has not had flatus. She has not had bowel movement.  Lochia Minimal. Foley catheter still in place. Desires circ for son, given time of delivery, planned for tomorrow. Reviewed need for lovenox postop given decreased mobility postop plus Factor V Leiden history  Objective: Blood pressure 114/70, pulse (!) 52, temperature 98.5 F (36.9 C), temperature source Oral, resp. rate 16, height 5\' 10"  (1.778 m), weight 86.2 kg, last menstrual period 12/29/2019, SpO2 95 %, unknown if currently breastfeeding.  Physical Exam:  General: alert, cooperative and no distress Lochia:normal flow Chest: CTAB Heart: RRR no m/r/g Abdomen: +BS, soft, nontender Uterine Fundus: firm, 2cm below umbilicus. Pressure dressing in place, clean dressing, scant dried blood noted below inferior edge Extremities: neg edema, neg calf TTP BL, neg Homans BL  Recent Labs    10/06/20 1737 10/08/20 0533  HGB 12.0 9.5*  HCT 35.2* 27.8*    Assessment/Plan:  ASSESSMENT: Shelley Walker is a 30 y.o. G2P1011 s/p PLTCS @ [redacted]w[redacted]d for arrest of dilation. PNC c/b Factor V Leiden, fetal pelvic kidney.   Breastfeeding, Lactation consult and Circumcision prior to discharge  FVL - postop lovenox tonight at 2200 Circ tomorrow given time of delivery Recheck pressure dressing in afternoon, change if needed, clean this morning.  DC Foley when appropriate   LOS: 2 days

## 2020-10-08 NOTE — Transfer of Care (Signed)
Immediate Anesthesia Transfer of Care Note  Patient: Shelley Walker  Procedure(s) Performed: CESAREAN SECTION (N/A )  Patient Location: PACU  Anesthesia Type:Epidural  Level of Consciousness: awake, alert  and oriented  Airway & Oxygen Therapy: Patient Spontanous Breathing  Post-op Assessment: Report given to RN and Post -op Vital signs reviewed and stable  Post vital signs: Reviewed and stable  Last Vitals:  Vitals Value Taken Time  BP 127/57 10/08/20 0145  Temp    Pulse 102 10/08/20 0147  Resp 19 10/08/20 0147  SpO2 99 % 10/08/20 0147  Vitals shown include unvalidated device data.  Last Pain:  Vitals:   10/07/20 2128  TempSrc: Oral  PainSc:          Complications: No complications documented.

## 2020-10-08 NOTE — Op Note (Signed)
Operative Note    Preoperative Diagnosis 1. IUP at 40 4/7wks  2. Arrest of dilation    Postoperative Diagnosis: Same  3. Large for gestational age   Procedure: Primary low transverse cesarean section with double layered closure   Surgeon: Britt Bottom DO   Anesthesia: Epidural  Fluids: EBL: UOP:   Findings: Viable female infant in vertex position. Loose nuchal x 1. Apgars 8,9, Wt pending Grossly normal uterus, tubes and ovaries   Specimen: Placenta to L/D   Procedure Note  Consent verified pre-op. All questions answered  Cesarean section was delayed after being called by 2 hrs due to an emergent cesarean section taking precedence. Pt and baby remained stable   Patient was taken to the operating room where epidural anesthesia was redosed, tested and found to be adequate. She was prepped and draped in the normal sterile fashion and placed in the dorsal supine position with a leftward tilt. An appropriate time out was performed. A Pfannenstiel skin incision was then made two finger breaths above the pubic symphysis with the scalpel and carried through to the underlying layer of fascia by sharp dissection and Bovie cautery. The fascia was nicked in the midline and the incision was extended laterally with Mayo scissors. The superior and inferior aspects of the incision were grasped kocher clamps and dissected off the underlying rectus muscles.Rectus muscles were separated in the midline  and the peritoneal cavity entered bluntly. The peritoneal incision was then extended both superiorly and inferiorly with careful attention to avoid both bowel and bladder. The Alexis self-retaining wound retractor was then placed and the lower uterine segment exposed. The bladder flap was developed with Metzenbaum scissors and pushed away from the lower uterine segment. The lower uterine segment was then incised in a transverse fashion and the cavity itself entered bluntly. The incision  was extended bluntly. The infant's head was then lifted and delivered from the incision without difficulty. A l;oose nuchal x 1 was reduced over infants head.  Vigorous spontaneous cry was noted during delivery.  The remainder of the infant delivered and the nose and mouth bulb suctioned. The cord was clamped and cut after a minute delay and cord blood obtained. The infant was handed off to the waiting NICU team.  The placenta was then spontaneously expressed from the uterus and the uterus cleared of all clots and debris with moist lap sponge. Several large vessels were noted to be bleeding at the incision edges and the uterus was noted to be boggy. Pitocin administered.  The vessels were clamped the uterine incision was then repaired in 2 layer. The first layer was a running locked layer 1-0 chromic and the second an imbricating layer of the same suture. There was a small extension on the left side leaving a thin broad ligament area which was however hemostatic. A figure eight stitch was placed to provide tensile stability.  The tubes and ovaries were inspected, found to be normal, and the gutters cleared of all clots and debris. The uterine incision was inspected agai and found to be hemostatic. All instruments and sponges were then removed from the abdomen. The peritoneum and rectus muscles were  then reapproximated in a running fashion with sutures of 2-0 Vicryl.  The fascia was then closed with 0 Vicryl in a running fashion. Subcutaneous tissue was reapproximated with 3-0 plain in a running fashion. The skin was closed with a subcuticular stitch of 4-0 Vicryl on a Keith needle and then reinforced with  benzoin and Steri-Strips.  At the conclusion of the procedure all instruments and sponge counts were correct. Patient was taken to the recovery room in good condition with her baby accompanying her skin to skin.   They desire circumcision for baby

## 2020-10-09 NOTE — Progress Notes (Signed)
POD #1 LTCS Doing well, pain controlled Afeb, VSS Abd- soft, fundus firm, pressure dressing in place, honeycomb with bloody drainage  Continue routine care, ambulate, will remove pressure dressing and replace honeycomb.  Continue Lovenox for heterozygous Factor V leiden, continue with baby ASA at discharge

## 2020-10-09 NOTE — Lactation Note (Signed)
This note was copied from a baby's chart. Lactation Consultation Note Baby 45 hrs old. Fussy, cluster feeding. Mom's nipples are red, tender. Wide space breast breast tissue filling. Hand expression of 5 ml colostrum spoon fed to baby so mom's nipples can rest.  Mom shown how to use DEBP & how to disassemble, clean, & reassemble parts. Mom knows to pump q3h for 15-20 min. LC suggested that let her nipples rest between feedings since baby is cluster feeding and not pump at this time since baby is feeding so often. Suggested hand expression and spoon feeding in order to get the most colostrum for supplementing.   Patient Name: Shelley Walker [id number] Date: 10/09/2020 Reason for consult: Follow-up assessment;Primapara;Term Age:30 hours  Maternal Data    Feeding    LATCH Score       Type of Nipple: Everted at rest and after stimulation  Comfort (Breast/Nipple): Filling, red/small blisters or bruises, mild/mod discomfort (red, sore)         Lactation Tools Discussed/Used    Interventions Interventions: Support pillows;Position options;Expressed milk;Breast massage;Hand express;Breast compression;Breast feeding basics reviewed  Discharge    Consult Status Consult Status: Follow-up Date: 10/10/20 Follow-up type: In-patient    Charyl Dancer 10/09/2020, 10:42 PM

## 2020-10-09 NOTE — Lactation Note (Signed)
This note was copied from a baby's chart. Lactation Consultation Note Mom is concerned about baby being hungry. LC suggested DBM for supplementation. Mom stated the thought of someone else's milk creeps her out. Mom came in breast/formula. Mom stated she prefers to stick w/formula for supplementing. Reviewed formula and supplementing.  Patient Name: Shelley Walker IONGE'X Date: 10/09/2020 Reason for consult: Follow-up assessment;Primapara;Term Age:42 hours  Maternal Data    Feeding    LATCH Score       Type of Nipple: Everted at rest and after stimulation  Comfort (Breast/Nipple): Filling, red/small blisters or bruises, mild/mod discomfort (red, sore)         Lactation Tools Discussed/Used    Interventions Interventions: Support pillows;Position options;Expressed milk;Breast massage;Hand express;Breast compression;Breast feeding basics reviewed  Discharge    Consult Status Consult Status: Follow-up Date: 10/10/20 Follow-up type: In-patient    Charyl Dancer 10/09/2020, 11:16 PM

## 2020-10-09 NOTE — Lactation Note (Signed)
This note was copied from a baby's chart. Lactation Consultation Note  Patient Name: Shelley Walker [id number] Date: 10/09/2020 Reason for consult: Follow-up assessment;Primapara;1st time breastfeeding;Difficult latch;Term Age:30 hours   RN called for latch assistance.  As I entered the room father stated, "We're about to give up."  Explored parents' feelings regarding breast feeding.  Mother reported that baby does not open his mouth well to latch and latching hurts.  He is sleepy and not showing any interest in breast feeding.  Mother is concerned whether or not he is "getting enough."  Baby had a circumcision this morning.  Asked mother to hand express and I finger fed drops to baby.  His suck was tight and uncoordinated initially.  He tried to tongue thrust.  Performed suck training for 10 minutes while providing drops.  Mother's breast tissue is minimal and firm; everted nipples.  Suggested the cross cradle position and attempted to latch.  He would not open his mouth.  Provided a few mls of formula and awakened baby.  Latched but would not suck.  After trying breast compressions and gentle stimulation I used formula drops at the breast and he finally began sucking.  For the first time, mother denied pain.  Observed him sucking on/off for 15 minutes as long as the formula drops were used.  Upon release, mother's nipple was well rounded and intact. Suggested parents provide some supplementation after feedings to help minimize weight loss and jaundice level.  Baby easily consumed 20 mls of Similac 20 using the gold nipple.  Offered to initiate the DEBP and mother interested but would like to wait until after dinner.  Acknowledged her request and grandmother arrived with dinner as I was leaving.  Will report to day shift RN to be sure night shift RN gets the pump set up as soon as possible.  Reminded mother to call for latch assistance during the evening and night as needed.  RN  updated.     Maternal Data Has patient been taught Hand Expression?: Yes Does the patient have breastfeeding experience prior to this delivery?: No  Feeding Mother's Current Feeding Choice: Breast Milk and Formula  LATCH Score Latch: Repeated attempts needed to sustain latch, nipple held in mouth throughout feeding, stimulation needed to elicit sucking reflex.  Audible Swallowing: None  Type of Nipple: Everted at rest and after stimulation  Comfort (Breast/Nipple): Soft / non-tender  Hold (Positioning): Assistance needed to correctly position infant at breast and maintain latch.  LATCH Score: 6   Lactation Tools Discussed/Used Tools: Comfort gels  Interventions Interventions: Breast feeding basics reviewed;Assisted with latch;Skin to skin;Breast massage;Hand express;Breast compression;Adjust position;Position options;Support pillows;Education  Discharge    Consult Status Consult Status: Follow-up Date: 10/10/20 Follow-up type: In-patient    Jolena Kittle R Anddy Wingert 10/09/2020, 6:08 PM

## 2020-10-09 NOTE — Lactation Note (Signed)
This note was copied from a baby's chart. Lactation Consultation Note  Patient Name: Shelley Walker [id number] Date: 10/09/2020 Reason for consult: Follow-up assessment;Term;Primapara;1st time breastfeeding Age:30 hours   P1 mother whose infant is now 57 hours old.  This is a term baby at 40+4 weeks.  Baby has an 8% weight loss this morning.  Reviewed breast feeding basics with parents including weight loss.  Baby has had multiple voids/stools.  Mother uncertain as to whether or not baby is "getting enough."  She asked her RN for formula this morning.  At this time baby is asleep in father's arms.  Asked mother to call me back for the next feeding so I may assist with latching.    I will provide more education when I return for the next feeding.  RN updated.   Maternal Data    Feeding Mother's Current Feeding Choice: Breast Milk and Formula  LATCH Score                    Lactation Tools Discussed/Used    Interventions    Discharge    Consult Status Consult Status: Follow-up Date: 10/09/20 Follow-up type: In-patient    Shelley Walker 10/09/2020, 1:51 PM

## 2020-10-10 NOTE — Progress Notes (Signed)
POD #2 LTCS Doing well, pain ok Afeb, VSS Abd- soft, fundus firm, incision intact Continue routine care

## 2020-10-11 MED ORDER — OXYCODONE HCL 5 MG PO TABS: 5.0000 mg | ORAL_TABLET | ORAL | 0 refills | Status: DC | PRN

## 2020-10-11 MED ORDER — IBUPROFEN 600 MG PO TABS: 600.0000 mg | ORAL_TABLET | Freq: Four times a day (QID) | ORAL | 0 refills | Status: DC

## 2020-10-11 NOTE — Lactation Note (Addendum)
This note was copied from a baby's chart. Lactation Consultation Note  Patient Name: Shelley Walker [id number] Date: 10/11/2020 Reason for consult: Follow-up assessment;Primapara;Term;Nipple pain/trauma Age:30 days  Infant has gained 60 g over the last 24 hours despite having had 8 stools & 3+ voids (and only 58 mL of formula) during that time period. Infant has continued to stool frequently & the last stool was yellow. A small amount of uric acid crystals was noted.  Mom's L nipple has a moderate compression stripe. Her R nipple has a compression stripe along with nipple abrasion on the nipple surface, along with a mark on the R areola. Mom is not likely using an asymmetric technique; specifics of an asymmetric latch were shown via [employer].   Mom was interested in trying the technique referred to above; Eudelia Bunch was brought to the breast, but he was not interested in latching. Mom will call out when she is ready for me to return.  Comfort Gels were provided to Mom. Mom has a Lansinoh pump at home.  Glenetta Hew, RN, IBCLC

## 2020-10-11 NOTE — Discharge Summary (Signed)
Postpartum Discharge Summary      Patient Name: Shelley Walker DOB: [birth year] MRN: [id number]  Date of admission: 10/06/2020 Delivery date:10/08/2020  Delivering provider: Pry[facility] Ochoa [facility]  Date of discharge: 10/11/2020  Admitting diagnosis: Indication for care in labor or delivery [O75.9] Intrauterine pregnancy: [redacted]w[redacted]d     Secondary diagnosis:  Active Problems:   Indication for care in labor or delivery   Status post primary low transverse cesarean section  Additional problems: fetal pelvic kidney    Discharge diagnosis: Term Pregnancy Delivered and arrest of dilation                                              Hospital course: Induction of Labor With Cesarean Section   30 y.o. yo G2P1011 at [redacted]w[redacted]d was admitted to the hospital 10/06/2020 for induction of labor. Patient had a labor course significant for protracted labor. The patient went for cesarean section due to Arrest of Dilation. Delivery details are as follows: Membrane Rupture Time/Date: 7:50 PM ,10/06/2020   Delivery Method:C-Section, Low Transverse  Details of operation can be found in separate operative Note.  Patient had an uncomplicated postpartum course. She is ambulating, tolerating a regular diet, passing flatus, and urinating well.  Patient is discharged home in stable condition on 10/11/20.      Newborn Data: Birth date:10/08/2020  Birth time:12:57 AM  Gender:Female  Living status:Living  Apgars:9 ,9  Weight:3975 g                                  Physical exam  Vitals:   10/10/20 0616 10/10/20 1439 10/10/20 2028 10/11/20 0631  BP: 138/74 119/80 126/75 137/72  Pulse: (!) 44 77 65 (!) 47  Resp: 16 18 18 18   Temp: 98.2 F (36.8 C)  98.1 F (36.7 C) 98.3 F (36.8 C)  TempSrc: Oral  Oral Oral  SpO2:   97% 97%  Weight:      Height:       General: alert Lochia: appropriate Uterine Fundus: firm Incision: Healing well with no significant drainage  Labs: Lab Results  Component Value Date   WBC  23.6 (H) 10/08/2020   HGB 9.5 (L) 10/08/2020   HCT 27.8 (L) 10/08/2020   MCV 93.0 10/08/2020   PLT 147 (L) 10/08/2020   CMP Latest Ref Rng & Units 10/08/2020  Creatinine 0.44 - 1.00 mg/dL 10/10/2020)   1.32(G Score: Edinburgh Postnatal Depression Scale Screening Tool 10/08/2020  I have been able to laugh and see the funny side of things. 0  I have looked forward with enjoyment to things. 0  I have blamed myself unnecessarily when things went wrong. 1  I have been anxious or worried for no good reason. 1  I have felt scared or panicky for no good reason. 0  Things have been getting on top of me. 0  I have been so unhappy that I have had difficulty sleeping. 0  I have felt sad or miserable. 0  I have been so unhappy that I have been crying. 1  The thought of harming myself has occurred to me. 0  Edinburgh Postnatal Depression Scale Total 3      After visit meds:  Allergies as of 10/11/2020   No Known Allergies     Medication List  TAKE these medications   acetaminophen 500 MG tablet Commonly known as: TYLENOL Take 500 mg by mouth every 6 (six) hours as needed.   aspirin EC 81 MG tablet Take 81 mg by mouth daily. Swallow whole.   diphenhydrAMINE 25 MG tablet Commonly known as: BENADRYL Take 25 mg by mouth every 6 (six) hours as needed.   ibuprofen 600 MG tablet Commonly known as: ADVIL Take 1 tablet (600 mg total) by mouth every 6 (six) hours.   oxyCODONE 5 MG immediate release tablet Commonly known as: Oxy IR/ROXICODONE Take 1-2 tablets (5-10 mg total) by mouth every 4 (four) hours as needed for severe pain.   prenatal multivitamin Tabs tablet Take 1 tablet by mouth daily at 12 noon.            Discharge Care Instructions  (From admission, onward)         Start     Ordered   10/11/20 0000  Discharge wound care:       Comments: Remove honeycomb POD #5   10/11/20 0933           Discharge home in stable condition Infant Feeding: Breast Infant  Disposition:home with mother Discharge instruction: per After Visit Summary and Postpartum booklet. Activity: Advance as tolerated. Pelvic rest for 6 weeks.  Diet: routine diet  Postpartum Appointment:2 weeks Follow up Visit:  Follow-up Information    Edwinna Areola, DO. Schedule an appointment as soon as possible for a visit in 2 week(s).   Specialty: Obstetrics and Gynecology Contact information: [address] [place] [address] [phone]                   10/11/2020 Zenaida Niece, MD

## 2020-10-11 NOTE — Progress Notes (Signed)
POD #3 LTCS Doing well, ready to go home Afeb, VSS Abd- soft, fundus firm, incision intact D/c home 

## 2020-10-11 NOTE — Discharge Instructions (Signed)
As per discharge pamphlet °

## 2021-09-02 ENCOUNTER — Ambulatory Visit: Payer: 59 | Admitting: Family

## 2021-10-11 ENCOUNTER — Ambulatory Visit (INDEPENDENT_AMBULATORY_CARE_PROVIDER_SITE_OTHER): Payer: 59 | Admitting: Family

## 2021-10-11 ENCOUNTER — Encounter: Payer: Self-pay | Admitting: Family

## 2021-10-11 VITALS — BP 115/77 | HR 64 | Temp 98.4°F | Ht 70.0 in | Wt 152.0 lb

## 2021-10-11 DIAGNOSIS — Z0000 Encounter for general adult medical examination without abnormal findings: Secondary | ICD-10-CM

## 2021-10-11 LAB — CBC WITH DIFFERENTIAL/PLATELET
Basophils Absolute: 0 10*3/uL (ref 0.0–0.1)
Basophils Relative: 0.7 % (ref 0.0–3.0)
Eosinophils Absolute: 0.1 10*3/uL (ref 0.0–0.7)
Eosinophils Relative: 1.4 % (ref 0.0–5.0)
HCT: 39.4 % (ref 36.0–46.0)
Hemoglobin: 13.2 g/dL (ref 12.0–15.0)
Lymphocytes Relative: 29.3 % (ref 12.0–46.0)
Lymphs Abs: 1.8 10*3/uL (ref 0.7–4.0)
MCHC: 33.6 g/dL (ref 30.0–36.0)
MCV: 85.9 fl (ref 78.0–100.0)
Monocytes Absolute: 0.4 10*3/uL (ref 0.1–1.0)
Monocytes Relative: 6.7 % (ref 3.0–12.0)
Neutro Abs: 3.9 10*3/uL (ref 1.4–7.7)
Neutrophils Relative %: 61.9 % (ref 43.0–77.0)
Platelets: 290 10*3/uL (ref 150.0–400.0)
RBC: 4.59 Mil/uL (ref 3.87–5.11)
RDW: 13.5 % (ref 11.5–15.5)
WBC: 6.3 10*3/uL (ref 4.0–10.5)

## 2021-10-11 LAB — COMPREHENSIVE METABOLIC PANEL
ALT: 13 U/L (ref 0–35)
AST: 18 U/L (ref 0–37)
Albumin: 4.8 g/dL (ref 3.5–5.2)
Alkaline Phosphatase: 56 U/L (ref 39–117)
BUN: 14 mg/dL (ref 6–23)
CO2: 31 mEq/L (ref 19–32)
Calcium: 10.9 mg/dL — ABNORMAL HIGH (ref 8.4–10.5)
Chloride: 103 mEq/L (ref 96–112)
Creatinine, Ser: 1 mg/dL (ref 0.40–1.20)
GFR: 75.21 mL/min (ref >60.00)
Glucose, Bld: 86 mg/dL (ref 70–99)
Potassium: 5 mEq/L (ref 3.5–5.1)
Sodium: 139 mEq/L (ref 135–145)
Total Bilirubin: 0.3 mg/dL (ref 0.2–1.2)
Total Protein: 7.4 g/dL (ref 6.0–8.3)

## 2021-10-11 LAB — TSH: TSH: 1.67 u[IU]/mL (ref 0.35–5.50)

## 2021-10-11 LAB — LIPID PANEL
Cholesterol: 138 mg/dL (ref 0–200)
HDL: 63.1 mg/dL (ref >39.00)
LDL Cholesterol: 59 mg/dL (ref 0–99)
NonHDL: 74.87
Total CHOL/HDL Ratio: 2
Triglycerides: 77 mg/dL (ref 0.0–149.0)
VLDL: 15.4 mg/dL (ref 0.0–40.0)

## 2021-10-11 NOTE — Progress Notes (Signed)
Phone [phone]   Subjective:   Patient is a 31 y.o. female presenting for annual physical.    Chief Complaint  Patient presents with   Establish Care   Annual Exam    Fasting W/ Labs    See problem oriented charting- ROS- full  review of systems was completed and negative.   The following were reviewed and entered/updated in epic: Past Medical History:  Diagnosis Date   Factor V Leiden [facility])    Patient Active Problem List   Diagnosis Date Noted   Status post primary low transverse cesarean section 10/08/2020   Indication for care in labor or delivery 10/06/2020   COVID-19 06/08/2020   Past Surgical History:  Procedure Laterality Date   CESAREAN SECTION N/A 10/08/2020   Procedure: CESAREAN SECTION;  Surgeon: Sherlyn Hay, DO;  Location: [facility] LD ORS;  Service: Obstetrics;  Laterality: N/A;   EYE SURGERY     age 33   WISDOM TOOTH EXTRACTION  2010    Family History  Problem Relation Age of Onset   Diabetes Neg Hx    Stroke Neg Hx    Hypertension Neg Hx     Medications- reviewed and updated Current Outpatient Medications  Medication Sig Dispense Refill   aspirin EC 81 MG tablet Take 81 mg by mouth daily. Swallow whole.     No current facility-administered medications for this visit.    Allergies-reviewed and updated No Known Allergies  Social History   Social History Narrative   ** Merged History Encounter **       Objective  Objective:  BP 115/77 (BP Location: Left Arm, Patient Position: Sitting, Cuff Size: Large)   Pulse 64   Temp 98.4 F (36.9 C) (Temporal)   Ht $R'5\' 10"'Ob$  (1.778 m)   Wt 152 lb (68.9 kg)   LMP 10/03/2021 (Exact Date)   SpO2 100%   Breastfeeding No   BMI 21.81 kg/m  Physical Exam Vitals and nursing note reviewed.  Constitutional:      Appearance: Normal appearance.  HENT:     Head: Normocephalic.     Right Ear: Tympanic membrane normal.     Left Ear: Tympanic membrane normal.     Nose: Nose normal.      Mouth/Throat:     Mouth: Mucous membranes are moist.  Eyes:     Pupils: Pupils are equal, round, and reactive to light.  Cardiovascular:     Rate and Rhythm: Normal rate and regular rhythm.  Pulmonary:     Effort: Pulmonary effort is normal.     Breath sounds: Normal breath sounds.  Musculoskeletal:        General: Normal range of motion.     Cervical back: Normal range of motion.  Lymphadenopathy:     Cervical: No cervical adenopathy.  Skin:    General: Skin is warm and dry.  Neurological:     Mental Status: She is alert.  Psychiatric:        Mood and Affect: Mood normal.        Behavior: Behavior normal.      Assessment and Plan   Health Maintenance counseling: 1. Anticipatory guidance: Patient counseled regarding regular dental exams q6 months, eye exams,  avoiding smoking and second hand smoke, limiting alcohol to 1 beverage per day, no illicit drugs.   2. Risk factor reduction:  Advised patient of need for regular exercise and diet rich with fruits and vegetables to reduce risk of heart attack and stroke. Wt Readings from  Last 3 Encounters:  10/11/21 152 lb (68.9 kg)  10/06/20 190 lb (86.2 kg)   3. Immunizations/screenings/ancillary studies Immunization History  Administered Date(s) Administered   MMR 10/11/2020   Tdap 07/16/2020   Health Maintenance Due  Topic Date Due   COVID-19 Vaccine (1) Never done   Hepatitis C Screening  Never done   PAP SMEAR-Modifier  Never done    4. Cervical cancer screening: last year.  5. Skin cancer screening- advised regular sunscreen use. Denies worrisome, changing, or new skin lesions.  6. Birth control/STD check: N/A 7. Smoking associated screening: non- smoker 8. Alcohol screening: none  Problem List Items Addressed This Visit   None Visit Diagnoses     Annual physical exam    -  Primary pt reports hx of low sodium levels and POTS as a teen, played volleyball, very active, also played in college and would take salt tabs  occasionally - has had a few random dizzy spells in last year or 2, resolve quickly. Pt hydrates 3-4L water daily, advised to cut back to 2-3L to not oversaturate her sodium levels.    Relevant Orders   Comprehensive metabolic panel   TSH   Lipid panel   CBC with Differential/Platelet       Recommended follow up: No follow-ups on file. No future appointments.  Lab/Order associations:fasting    Jeanie Sewer, NP

## 2021-10-11 NOTE — Patient Instructions (Signed)
Welcome to [facility], It was a pleasure meeting you today!  Go to the lab for blood work today.  Have a great week!      PLEASE NOTE: If you had any LAB tests please let us know if you have not heard back within a few days. You may see your results on MyChart before we have a chance to review them but we will give you a call once they are reviewed by [place]. If we ordered any REFERRALS today, please let us know if you have not heard from their office within the next week.  Let us know through MyChart if you are needing REFILLS, or have your pharmacy send [place] the request. You can also use MyChart to communicate with me or any office staff.

## 2021-10-15 NOTE — Progress Notes (Signed)
Hi Pammy,  All labs are within normal range.  Glucose (sugar), electrolytes, kidney and liver function, blood count, thyroid, and cholesterol numbers are all good.   Keep up the good work with controlling your diet and continue to try and shoot for 30 minutes of exercise daily!

## 2022-02-14 ENCOUNTER — Encounter: Payer: Self-pay | Admitting: *Deleted

## 2022-03-09 ENCOUNTER — Ambulatory Visit (INDEPENDENT_AMBULATORY_CARE_PROVIDER_SITE_OTHER): Payer: 59 | Admitting: Family

## 2022-03-09 ENCOUNTER — Telehealth: Payer: Self-pay

## 2022-03-09 ENCOUNTER — Encounter: Payer: Self-pay | Admitting: Family

## 2022-03-09 VITALS — BP 112/64 | HR 107 | Temp 98.0°F | Ht 70.0 in | Wt 153.4 lb

## 2022-03-09 DIAGNOSIS — J020 Streptococcal pharyngitis: Secondary | ICD-10-CM

## 2022-03-09 LAB — POC COVID19 BINAXNOW: SARS Coronavirus 2 Ag: NEGATIVE

## 2022-03-09 LAB — POCT RAPID STREP A (OFFICE): Rapid Strep A Screen: POSITIVE — AB

## 2022-03-09 MED ORDER — AMOXICILLIN 500 MG PO CAPS: 500.0000 mg | ORAL_CAPSULE | Freq: Two times a day (BID) | ORAL | 0 refills | Status: AC

## 2022-03-09 NOTE — Progress Notes (Signed)
Patient ID: Shelley Walker, female    DOB: 21-Sep-1990, 31 y.o.   MRN: [id number]  Chief Complaint  Patient presents with   Sore Throat    sx started yesterday    HPI:      Sore throat:  Pt c/o sore throat since yesterday. Her co-worker and her son have recently been sick with colds/ strep throat. She also reports a low grade fever, fatigue this am & coughing up greenish mucus this am, she denies any other sinus sx.  Has tried gargling salt water which did not help.       Assessment & Plan:  1. Strep sore throat rapid strep positive. rapid covid neg. rapid testing negative. Sending AMOX, advised on use & SE, also can take up to 600mg  Ibuprofen tid, or 2 generic -Aleve bid for pain, increase fluid intake.  - POCT rapid strep A - POC COVID-19 - amoxicillin (AMOXIL) 500 MG capsule; Take 1 capsule (500 mg total) by mouth 2 (two) times daily for 10 days.  Dispense: 20 capsule; Refill: 0   Subjective:    Outpatient Medications Prior to Visit  Medication Sig Dispense Refill   aspirin EC 81 MG tablet Take 81 mg by mouth daily. Swallow whole.     Magnesium 250 MG TABS Take by mouth.     Multiple Vitamins-Minerals (ONE-A-DAY WOMENS PO) Take by mouth.     vitamin C (ASCORBIC ACID) 500 MG tablet Take 500 mg by mouth daily.     Wheat Dextrin (BENEFIBER) POWD Take by mouth.     zinc gluconate 50 MG tablet Take 50 mg by mouth daily.     aspirin 81 MG chewable tablet Chew 1 tablet every day by oral route.     No facility-administered medications prior to visit.   Past Medical History:  Diagnosis Date   Factor V Leiden [facility])    Indication for care in labor or delivery 10/06/2020   Status post primary low transverse cesarean section 10/08/2020   Past Surgical History:  Procedure Laterality Date   CESAREAN SECTION N/A 10/08/2020   Procedure: CESAREAN SECTION;  Surgeon: Sherlyn Hay, DO;  Location: [facility] LD ORS;  Service: Obstetrics;  Laterality: N/A;   EYE SURGERY     age 71    WISDOM TOOTH EXTRACTION  2010   No Known Allergies    Objective:    Physical Exam Vitals and nursing note reviewed.  Constitutional:      Appearance: Normal appearance. She is not ill-appearing.     Interventions: Face mask in place.  HENT:     Right Ear: Tympanic membrane and ear canal normal.     Left Ear: Tympanic membrane and ear canal normal.     Nose:     Right Sinus: No frontal sinus tenderness.     Left Sinus: No frontal sinus tenderness.     Mouth/Throat:     Mouth: Mucous membranes are moist.     Pharynx: Posterior oropharyngeal erythema (mild) present. No pharyngeal swelling, oropharyngeal exudate or uvula swelling.     Tonsils: No tonsillar exudate or tonsillar abscesses.  Cardiovascular:     Rate and Rhythm: Normal rate and regular rhythm.  Pulmonary:     Effort: Pulmonary effort is normal.     Breath sounds: Normal breath sounds.  Musculoskeletal:        General: Normal range of motion.  Lymphadenopathy:     Head:     Right side of head: No preauricular or posterior auricular  adenopathy.     Left side of head: No preauricular or posterior auricular adenopathy.     Cervical: No cervical adenopathy.  Skin:    General: Skin is warm and dry.  Neurological:     Mental Status: She is alert.  Psychiatric:        Mood and Affect: Mood normal.        Behavior: Behavior normal.    BP 112/64 (BP Location: Left Arm, Patient Position: Sitting, Cuff Size: Large)   Pulse (!) 107   Temp 98 F (36.7 C) (Temporal)   Ht 5\' 10"  (1.778 m)   Wt 153 lb 6 oz (69.6 kg)   LMP 02/04/2022 (Exact Date)   SpO2 99%   Breastfeeding No   BMI 22.01 kg/m  Wt Readings from Last 3 Encounters:  03/09/22 153 lb 6 oz (69.6 kg)  10/11/21 152 lb (68.9 kg)  10/06/20 190 lb (86.2 kg)       10/08/20, NP

## 2022-03-09 NOTE — Telephone Encounter (Signed)
I called pt to let her know strep test was positive and apologized to pt. Pt is aware [facility] [place] will be sending in an antibiotic. Pt gave a verbalized understanding.

## 2022-04-02 ENCOUNTER — Emergency Department (HOSPITAL_COMMUNITY)
Admission: EM | Admit: 2022-04-02 | Discharge: 2022-04-02 | Disposition: A | Discharge status: Home / Self Care | Payer: 59 | Attending: Emergency Medicine | Admitting: Emergency Medicine

## 2022-04-02 ENCOUNTER — Encounter (HOSPITAL_COMMUNITY): Payer: Self-pay

## 2022-04-02 ENCOUNTER — Other Ambulatory Visit: Payer: Self-pay

## 2022-04-02 ENCOUNTER — Emergency Department (HOSPITAL_COMMUNITY): Payer: 59

## 2022-04-02 DIAGNOSIS — R0789 Other chest pain: Secondary | ICD-10-CM | POA: Diagnosis not present

## 2022-04-02 DIAGNOSIS — Z20822 Contact with and (suspected) exposure to covid-19: Secondary | ICD-10-CM | POA: Diagnosis not present

## 2022-04-02 DIAGNOSIS — R197 Diarrhea, unspecified: Secondary | ICD-10-CM | POA: Diagnosis present

## 2022-04-02 DIAGNOSIS — R109 Unspecified abdominal pain: Secondary | ICD-10-CM

## 2022-04-02 DIAGNOSIS — R1013 Epigastric pain: Secondary | ICD-10-CM | POA: Diagnosis not present

## 2022-04-02 DIAGNOSIS — R7989 Other specified abnormal findings of blood chemistry: Secondary | ICD-10-CM | POA: Insufficient documentation

## 2022-04-02 DIAGNOSIS — Z7982 Long term (current) use of aspirin: Secondary | ICD-10-CM | POA: Insufficient documentation

## 2022-04-02 DIAGNOSIS — R110 Nausea: Secondary | ICD-10-CM | POA: Diagnosis not present

## 2022-04-02 LAB — COMPREHENSIVE METABOLIC PANEL
ALT: 52 U/L — ABNORMAL HIGH (ref 0–44)
AST: 67 U/L — ABNORMAL HIGH (ref 15–41)
Albumin: 4.6 g/dL (ref 3.5–5.0)
Alkaline Phosphatase: 64 U/L (ref 38–126)
Anion gap: 9 (ref 5–15)
BUN: 14 mg/dL (ref 6–20)
CO2: 29 mmol/L (ref 22–32)
Calcium: 10.8 mg/dL — ABNORMAL HIGH (ref 8.9–10.3)
Chloride: 99 mmol/L (ref 98–111)
Creatinine, Ser: 0.97 mg/dL (ref 0.44–1.00)
GFR, Estimated: >60 mL/min (ref >60)
Glucose, Bld: 102 mg/dL — ABNORMAL HIGH (ref 70–99)
Potassium: 4 mmol/L (ref 3.5–5.1)
Sodium: 137 mmol/L (ref 135–145)
Total Bilirubin: 0.7 mg/dL (ref 0.3–1.2)
Total Protein: 8.4 g/dL — ABNORMAL HIGH (ref 6.5–8.1)

## 2022-04-02 LAB — CBG MONITORING, ED: Glucose-Capillary: 107 mg/dL — ABNORMAL HIGH (ref 70–99)

## 2022-04-02 LAB — CBC
HCT: 43.6 % (ref 36.0–46.0)
Hemoglobin: 14.4 g/dL (ref 12.0–15.0)
MCH: 28.9 pg (ref 26.0–34.0)
MCHC: 33 g/dL (ref 30.0–36.0)
MCV: 87.6 fL (ref 80.0–100.0)
Platelets: 389 10*3/uL (ref 150–400)
RBC: 4.98 MIL/uL (ref 3.87–5.11)
RDW: 12.7 % (ref 11.5–15.5)
WBC: 17.3 10*3/uL — ABNORMAL HIGH (ref 4.0–10.5)
nRBC: 0 % (ref 0.0–0.2)

## 2022-04-02 LAB — URINALYSIS, ROUTINE W REFLEX MICROSCOPIC
Bilirubin Urine: NEGATIVE
Glucose, UA: NEGATIVE mg/dL
Hgb urine dipstick: NEGATIVE
Ketones, ur: NEGATIVE mg/dL
Leukocytes,Ua: NEGATIVE
Nitrite: NEGATIVE
Protein, ur: NEGATIVE mg/dL
Specific Gravity, Urine: 1.009 (ref 1.005–1.030)
pH: 8 (ref 5.0–8.0)

## 2022-04-02 LAB — I-STAT BETA HCG BLOOD, ED (MC, WL, AP ONLY): I-stat hCG, quantitative: <5 m[IU]/mL (ref <5)

## 2022-04-02 LAB — TROPONIN I (HIGH SENSITIVITY): Troponin I (High Sensitivity): 2 ng/L (ref <18)

## 2022-04-02 LAB — RESP PANEL BY RT-PCR (FLU A&B, COVID) ARPGX2
Influenza A by PCR: NEGATIVE
Influenza B by PCR: NEGATIVE
SARS Coronavirus 2 by RT PCR: NEGATIVE

## 2022-04-02 LAB — LIPASE, BLOOD: Lipase: 35 U/L (ref 11–51)

## 2022-04-02 MED ORDER — ONDANSETRON 4 MG PO TBDP: ORAL_TABLET | ORAL | 0 refills | Status: DC

## 2022-04-02 MED ORDER — LOPERAMIDE HCL 2 MG PO CAPS
4.0000 mg | ORAL_CAPSULE | Freq: Once | ORAL | Status: AC
  Administered 2022-04-02: 4 mg via ORAL
  Filled 2022-04-02: qty 2

## 2022-04-02 MED ORDER — ONDANSETRON 4 MG PO TBDP
4.0000 mg | ORAL_TABLET | Freq: Once | ORAL | Status: AC
  Administered 2022-04-02: 4 mg via ORAL
  Filled 2022-04-02: qty 1

## 2022-04-02 NOTE — Discharge Instructions (Signed)
You likely have viral gastroenteritis.  As we discussed she has some nonspecific lab changes that is consistent with that including slightly elevated white blood cell count and slightly elevated liver enzyme function test  Stay hydrated and you may take Zofran for nausea  See your doctor for follow-up  Return to ER if you have worse diarrhea or abdominal cramps, abdominal pain

## 2022-04-02 NOTE — ED Provider Triage Note (Signed)
Emergency Medicine Provider Triage Evaluation Note  Krystal Teachey , a 31 y.o. female  was evaluated in triage.  Pt complains of diarrhea followed by chest pain and tightness for the last few hours. Reports mild shortness of breath and upper abdominal pain. Denies fever, chills.  Review of Systems  Positive: Chest pain, SOB Negative: Fever, chills  Physical Exam  BP 122/71 (BP Location: Right Arm)   Pulse 87   Temp 98.1 F (36.7 C) (Oral)   Resp 17   Ht 5\' 9"  (1.753 m)   Wt 68 kg   LMP 02/04/2022 (Exact Date)   SpO2 100%   BMI 22.15 kg/m  Gen:   Awake, no distress   Resp:  Normal effort  MSK:   Moves extremities without difficulty  Other:  Bilateral upper quadrant ttp w/o guarding  Medical Decision Making  Medically screening exam initiated at 4:58 PM.  Appropriate orders placed.  Anglea Gordner was informed that the remainder of the evaluation will be completed by another provider, this initial triage assessment does not replace that evaluation, and the importance of remaining in the ED until their evaluation is complete.     Neysa Bonito, Pete Pelt 04/02/22 1701

## 2022-04-02 NOTE — ED Provider Notes (Signed)
[facility]-EMERGENCY DEPT Provider Note   CSN: [id number] Arrival date & time: 04/02/22  1646     History  Chief Complaint  Patient presents with   Diarrhea    Shelley Walker is a 31 y.o. female history of recent strep infection treated with antibiotics, here presenting with abdominal cramps into the chest.  Patient states that earlier today, she was in [place] and she had some abdominal cramps and diarrhea.  She was driving back to [place] and then the cramps got worse and radiated to her chest.  She states that she felt nauseated at that time.  Patient states that she waited in the triage area and now her symptoms have resolved.  The history is provided by the patient.       Home Medications Prior to Admission medications   Medication Sig Start Date End Date Taking? Authorizing Provider  aspirin EC 81 MG tablet Take 81 mg by mouth daily. Swallow whole.    [provider]  Magnesium 250 MG TABS Take by mouth.    [provider]  Multiple Vitamins-Minerals (ONE-A-DAY WOMENS PO) Take by mouth.    [provider]  vitamin C (ASCORBIC ACID) 500 MG tablet Take 500 mg by mouth daily.    [provider]  Wheat Dextrin (BENEFIBER) POWD Take by mouth.    [provider]  zinc gluconate 50 MG tablet Take 50 mg by mouth daily.    [provider]      Allergies    Patient has no known allergies.    Review of Systems   Review of Systems  Gastrointestinal:  Positive for abdominal pain and diarrhea.  All other systems reviewed and are negative.   Physical Exam Updated Vital Signs BP (!) 136/97 (BP Location: Left Arm)   Pulse 90   Temp 98.3 F (36.8 C) (Oral)   Resp 16   Ht 5\' 9"  (1.753 m)   Wt 68 kg   LMP 04/02/2022 (Approximate)   SpO2 98%   BMI 22.15 kg/m  Physical Exam Vitals and nursing note reviewed.  Constitutional:      Appearance: Normal appearance.     Comments: Well appearing    HENT:     Head: Normocephalic.     Nose: Nose normal.     Mouth/Throat:     Mouth: Mucous membranes are moist.     Comments: Tonsils not enlarged  Eyes:     Extraocular Movements: Extraocular movements intact.     Pupils: Pupils are equal, round, and reactive to light.  Cardiovascular:     Rate and Rhythm: Normal rate and regular rhythm.     Pulses: Normal pulses.     Heart sounds: Normal heart sounds.  Pulmonary:     Effort: Pulmonary effort is normal.     Breath sounds: Normal breath sounds.  Abdominal:     General: Abdomen is flat.     Palpations: Abdomen is soft.     Comments: Minimal epigastric tenderness   Musculoskeletal:        General: Normal range of motion.     Cervical back: Normal range of motion and neck supple.  Skin:    General: Skin is warm.     Capillary Refill: Capillary refill takes less than 2 seconds.  Neurological:     General: No focal deficit present.     Mental Status: She is alert and oriented to person, place, and time.  Psychiatric:  Mood and Affect: Mood normal.        Behavior: Behavior normal.     ED Results / Procedures / Treatments   Labs (all labs ordered are listed, but only abnormal results are displayed) Labs Reviewed  CBC - Abnormal; Notable for the following components:      Result Value   WBC 17.3 (*)    All other components within normal limits  CBG MONITORING, ED - Abnormal; Notable for the following components:   Glucose-Capillary 107 (*)    All other components within normal limits  RESP PANEL BY RT-PCR (FLU A&B, COVID) ARPGX2  URINALYSIS, ROUTINE W REFLEX MICROSCOPIC  COMPREHENSIVE METABOLIC PANEL  LIPASE, BLOOD  I-STAT BETA HCG BLOOD, ED ([facility], [facility], [facility] ONLY)  TROPONIN I (HIGH SENSITIVITY)  TROPONIN I (HIGH SENSITIVITY)    EKG None  Radiology DG Chest 2 View  Result Date: 04/02/2022 CLINICAL DATA:  Chest pain and tightness for the last several hours. Shortness of breath. EXAM: CHEST - 2 VIEW COMPARISON:   None Available. FINDINGS: The heart size and mediastinal contours are within normal limits. Both lungs are clear. The visualized skeletal structures are unremarkable. IMPRESSION: No active cardiopulmonary disease. Electronically Signed   By: Gaylyn Rong M.D.   On: 04/02/2022 17:47    Procedures Procedures    Medications Ordered in ED Medications  loperamide (IMODIUM) capsule 4 mg (has no administration in time range)  ondansetron (ZOFRAN-ODT) disintegrating tablet 4 mg (has no administration in time range)    ED Course/ Medical Decision Making/ A&P                           Medical Decision Making Shelley Walker is a 31 y.o. female here presenting with a normal cramps and epigastric pain.  Patient just finished several courses of antibiotics for strep throat.  Patient has no evidence of strep throat right now.  I wonder if she has medication side effect.  I have low suspicion for C. difficile right now.  I think likely viral gastroenteritis.  Plan to get CBC and CMP and lipase and COVID test.  Low suspicion for ACS and 1 set of troponin sufficient.  11:02 PM Patient has WBC of 17.  LFTs minimally elevated.  COVID test is negative.  Patient feels better.  Likely viral gastroenteritis.  Will discharge home with Zofran and Imodium as needed.  Problems Addressed: Abdominal cramps: acute illness or injury Diarrhea, unspecified type: acute illness or injury  Amount and/or Complexity of Data Reviewed Labs: ordered. Decision-making details documented in ED Course. Radiology: ordered and independent interpretation performed. Decision-making details documented in ED Course.   ) / ED Diagnoses Final diagnoses:  None    Rx / DC Orders ED Discharge Orders     None         Charlynne Pander, MD 04/02/22 2307

## 2022-04-02 NOTE — ED Triage Notes (Signed)
Patient said she is having centralized chest pain that moves down to her abdomen and to her left shoulder. This started 2 hours ago. Had diarrhea. Felt really tired.

## 2022-05-05 ENCOUNTER — Encounter: Payer: Self-pay | Admitting: *Deleted

## 2023-01-18 LAB — OB RESULTS CONSOLE ANTIBODY SCREEN: Antibody Screen: NEGATIVE

## 2023-01-18 LAB — HEPATITIS C ANTIBODY: HCV Ab: NEGATIVE

## 2023-01-18 LAB — OB RESULTS CONSOLE GC/CHLAMYDIA
Chlamydia: NEGATIVE
Neisseria Gonorrhea: NEGATIVE

## 2023-01-18 LAB — OB RESULTS CONSOLE HIV ANTIBODY (ROUTINE TESTING): HIV: NONREACTIVE

## 2023-01-18 LAB — OB RESULTS CONSOLE HEPATITIS B SURFACE ANTIGEN: Hepatitis B Surface Ag: NEGATIVE

## 2023-01-18 LAB — OB RESULTS CONSOLE RPR: RPR: NONREACTIVE

## 2023-01-18 LAB — OB RESULTS CONSOLE RUBELLA ANTIBODY, IGM: Rubella: IMMUNE

## 2023-07-21 LAB — OB RESULTS CONSOLE GBS: GBS: NEGATIVE

## 2023-07-24 ENCOUNTER — Encounter (HOSPITAL_COMMUNITY): Payer: Self-pay

## 2023-07-24 ENCOUNTER — Encounter (HOSPITAL_COMMUNITY): Payer: Self-pay | Admitting: *Deleted

## 2023-07-24 NOTE — Patient Instructions (Signed)
 Dajia Gunnels  07/24/2023   Your procedure is scheduled on:  3.17.2025  Arrive at 0800 at Entrance C on [address] at [facility]  and [employer]. You are invited to use the FREE valet parking or use the Visitor's parking deck.  Pick up the phone at the desk and dial [phone].  Call this number if you have problems the morning of surgery: [phone]  Remember:   Do not eat food:(After Midnight) Desps de medianoche.  You may drink clear liquids until arrival at __0800___.  Clear liquids means a liquid you can see thru.  It can have color such as Cola or Kool aid.  Tea is OK and coffee as long as no milk or creamer of any kind..  Take these medicines the morning of surgery with A SIP OF WATER:  none   Do not wear jewelry, make-up or nail polish.  Do not wear lotions, powders, or perfumes. Do not wear deodorant.  Do not shave 48 hours prior to surgery.  Do not bring valuables to the hospital.  [facility] is not   responsible for any belongings or valuables brought to the hospital.  Contacts, dentures or bridgework may not be worn into surgery.  Leave suitcase in the car. After surgery it may be brought to your room.  For patients admitted to the hospital, checkout time is 11:00 AM the day of              discharge.      Please read over the following fact sheets that you were given:     Preparing for Surgery

## 2023-08-04 ENCOUNTER — Encounter (HOSPITAL_COMMUNITY)
Admission: RE | Admit: 2023-08-04 | Discharge: 2023-08-04 | Disposition: A | Discharge status: Home / Self Care | Payer: Self-pay | Source: Ambulatory Visit | Attending: Obstetrics and Gynecology | Admitting: Obstetrics and Gynecology

## 2023-08-04 DIAGNOSIS — Z01812 Encounter for preprocedural laboratory examination: Secondary | ICD-10-CM | POA: Insufficient documentation

## 2023-08-04 DIAGNOSIS — Z98891 History of uterine scar from previous surgery: Secondary | ICD-10-CM | POA: Insufficient documentation

## 2023-08-04 LAB — CBC
HCT: 36 % (ref 36.0–46.0)
Hemoglobin: 12 g/dL (ref 12.0–15.0)
MCH: 30.5 pg (ref 26.0–34.0)
MCHC: 33.3 g/dL (ref 30.0–36.0)
MCV: 91.4 fL (ref 80.0–100.0)
Platelets: 193 10*3/uL (ref 150–400)
RBC: 3.94 MIL/uL (ref 3.87–5.11)
RDW: 13.9 % (ref 11.5–15.5)
WBC: 13 10*3/uL — ABNORMAL HIGH (ref 4.0–10.5)
nRBC: 0 % (ref 0.0–0.2)

## 2023-08-04 LAB — TYPE AND SCREEN
ABO/RH(D): O POS
Antibody Screen: NEGATIVE

## 2023-08-05 LAB — RPR: RPR Ser Ql: NONREACTIVE

## 2023-08-06 ENCOUNTER — Encounter (HOSPITAL_COMMUNITY): Payer: Self-pay | Admitting: Obstetrics and Gynecology

## 2023-08-06 NOTE — Anesthesia Preprocedure Evaluation (Signed)
 Anesthesia Evaluation  Patient identified by MRN, date of birth, ID band Patient awake    Reviewed: Allergy & Precautions, NPO status , Patient's Chart, lab work & pertinent test results  Airway Mallampati: II       Dental no notable dental hx. (+) Teeth Intact, Dental Advisory Given   Pulmonary neg pulmonary ROS   Pulmonary exam normal breath sounds clear to auscultation       Cardiovascular negative cardio ROS Normal cardiovascular exam Rhythm:Regular Rate:Normal     Neuro/Psych negative neurological ROS  negative psych ROS   GI/Hepatic Neg liver ROS,GERD  ,,  Endo/Other  negative endocrine ROS    Renal/GU negative Renal ROS  negative genitourinary   Musculoskeletal negative musculoskeletal ROS (+)    Abdominal   Peds  Hematology  (+) Blood dyscrasia Factor V Leiden deficiency   Anesthesia Other Findings   Reproductive/Obstetrics (+) Pregnancy Hx/o previous C/Section                             Anesthesia Physical Anesthesia Plan  ASA: 2  Anesthesia Plan: Spinal   Post-op Pain Management:    Induction: Intravenous  PONV Risk Score and Plan: 4 or greater and Scopolamine patch - Pre-op and Treatment may vary due to age or medical condition  Airway Management Planned: Natural Airway  Additional Equipment: None and Fetal Monitoring  Intra-op Plan:   Post-operative Plan:   Informed Consent: I have reviewed the patients History and Physical, chart, labs and discussed the procedure including the risks, benefits and alternatives for the proposed anesthesia with the patient or authorized representative who has indicated his/her understanding and acceptance.       Plan Discussed with: Anesthesiologist  Anesthesia Plan Comments:         Anesthesia Quick Evaluation

## 2023-08-07 ENCOUNTER — Encounter (HOSPITAL_COMMUNITY): Payer: Self-pay | Admitting: Obstetrics and Gynecology

## 2023-08-07 ENCOUNTER — Inpatient Hospital Stay (HOSPITAL_COMMUNITY): Admitting: Anesthesiology

## 2023-08-07 ENCOUNTER — Encounter (HOSPITAL_COMMUNITY)
Admission: RE | Disposition: A | Discharge status: Home / Self Care | Payer: Self-pay | Source: Home / Self Care | Attending: Obstetrics and Gynecology

## 2023-08-07 ENCOUNTER — Inpatient Hospital Stay (HOSPITAL_COMMUNITY)
Admission: RE | Admit: 2023-08-07 | Discharge: 2023-08-09 | DRG: 787 | Disposition: A | Discharge status: Home / Self Care | Payer: 59 | Attending: Obstetrics and Gynecology | Admitting: Obstetrics and Gynecology

## 2023-08-07 ENCOUNTER — Other Ambulatory Visit: Payer: Self-pay

## 2023-08-07 DIAGNOSIS — O34219 Maternal care for unspecified type scar from previous cesarean delivery: Secondary | ICD-10-CM | POA: Diagnosis present

## 2023-08-07 DIAGNOSIS — D6851 Activated protein C resistance: Secondary | ICD-10-CM | POA: Diagnosis present

## 2023-08-07 DIAGNOSIS — K219 Gastro-esophageal reflux disease without esophagitis: Secondary | ICD-10-CM | POA: Diagnosis present

## 2023-08-07 DIAGNOSIS — Z3A39 39 weeks gestation of pregnancy: Secondary | ICD-10-CM

## 2023-08-07 DIAGNOSIS — Z3A37 37 weeks gestation of pregnancy: Secondary | ICD-10-CM | POA: Diagnosis not present

## 2023-08-07 DIAGNOSIS — Z98891 History of uterine scar from previous surgery: Principal | ICD-10-CM

## 2023-08-07 DIAGNOSIS — O34211 Maternal care for low transverse scar from previous cesarean delivery: Secondary | ICD-10-CM

## 2023-08-07 DIAGNOSIS — O9962 Diseases of the digestive system complicating childbirth: Secondary | ICD-10-CM | POA: Diagnosis present

## 2023-08-07 DIAGNOSIS — O9912 Other diseases of the blood and blood-forming organs and certain disorders involving the immune mechanism complicating childbirth: Secondary | ICD-10-CM | POA: Diagnosis present

## 2023-08-07 SURGERY — Surgical Case
Anesthesia: Spinal

## 2023-08-07 MED ORDER — PHENYLEPHRINE HCL-NACL 20-0.9 MG/250ML-% IV SOLN
INTRAVENOUS | Status: DC | PRN
  Administered 2023-08-07: 60 ug/min via INTRAVENOUS

## 2023-08-07 MED ORDER — OXYTOCIN-SODIUM CHLORIDE 30-0.9 UT/500ML-% IV SOLN
INTRAVENOUS | Status: AC
  Filled 2023-08-07: qty 500

## 2023-08-07 MED ORDER — OXYCODONE HCL 5 MG PO TABS: 5.0000 mg | ORAL_TABLET | ORAL | Status: DC | PRN

## 2023-08-07 MED ORDER — METOCLOPRAMIDE HCL 5 MG/ML IJ SOLN
INTRAMUSCULAR | Status: DC | PRN
Start: 2023-08-07 — End: 2023-08-07
  Administered 2023-08-07: 10 mg via INTRAVENOUS

## 2023-08-07 MED ORDER — IBUPROFEN 600 MG PO TABS
600.0000 mg | ORAL_TABLET | Freq: Four times a day (QID) | ORAL | Status: DC
Start: 2023-08-07 — End: 2023-08-10
  Administered 2023-08-07 – 2023-08-09 (×8): 600 mg via ORAL
  Filled 2023-08-07 (×9): qty 1

## 2023-08-07 MED ORDER — TRANEXAMIC ACID-NACL 1000-0.7 MG/100ML-% IV SOLN
INTRAVENOUS | Status: DC | PRN
  Administered 2023-08-07: 1000 mg via INTRAVENOUS

## 2023-08-07 MED ORDER — BUPIVACAINE IN DEXTROSE 0.75-8.25 % IT SOLN
INTRATHECAL | Status: DC | PRN
  Administered 2023-08-07: 1.8 mL via INTRATHECAL

## 2023-08-07 MED ORDER — POVIDONE-IODINE 10 % EX SWAB
2.0000 | Freq: Once | CUTANEOUS | Status: AC
  Administered 2023-08-07: 2 via TOPICAL

## 2023-08-07 MED ORDER — SODIUM CHLORIDE 0.9 % IV SOLN: INTRAVENOUS | Status: DC

## 2023-08-07 MED ORDER — SOD CITRATE-CITRIC ACID 500-334 MG/5ML PO SOLN
ORAL | Status: AC
  Filled 2023-08-07: qty 30

## 2023-08-07 MED ORDER — STERILE WATER FOR IRRIGATION IR SOLN
Status: DC | PRN
  Administered 2023-08-07: 1000 mL

## 2023-08-07 MED ORDER — CEFAZOLIN SODIUM-DEXTROSE 2-4 GM/100ML-% IV SOLN
2.0000 g | INTRAVENOUS | Status: AC
  Administered 2023-08-07: 2 g via INTRAVENOUS

## 2023-08-07 MED ORDER — SOD CITRATE-CITRIC ACID 500-334 MG/5ML PO SOLN
30.0000 mL | ORAL | Status: AC
  Administered 2023-08-07: 30 mL via ORAL

## 2023-08-07 MED ORDER — MENTHOL 3 MG MT LOZG: 1.0000 | LOZENGE | OROMUCOSAL | Status: DC | PRN

## 2023-08-07 MED ORDER — SCOPOLAMINE 1 MG/3DAYS TD PT72
MEDICATED_PATCH | TRANSDERMAL | Status: AC
  Filled 2023-08-07: qty 1

## 2023-08-07 MED ORDER — PHENYLEPHRINE HCL (PRESSORS) 10 MG/ML IV SOLN
INTRAVENOUS | Status: DC | PRN
  Administered 2023-08-07 (×4): 160 ug via INTRAVENOUS

## 2023-08-07 MED ORDER — ACETAMINOPHEN 500 MG PO TABS
1000.0000 mg | ORAL_TABLET | Freq: Four times a day (QID) | ORAL | Status: DC
  Administered 2023-08-07 – 2023-08-09 (×8): 1000 mg via ORAL
  Filled 2023-08-07 (×8): qty 2

## 2023-08-07 MED ORDER — NALOXONE HCL 4 MG/10ML IJ SOLN: 1.0000 ug/kg/h | INTRAVENOUS | Status: DC | PRN

## 2023-08-07 MED ORDER — SENNOSIDES-DOCUSATE SODIUM 8.6-50 MG PO TABS
2.0000 | ORAL_TABLET | Freq: Every day | ORAL | Status: DC
  Administered 2023-08-08 – 2023-08-09 (×2): 2 via ORAL
  Filled 2023-08-07 (×2): qty 2

## 2023-08-07 MED ORDER — SODIUM CHLORIDE 0.9 % IR SOLN
Status: DC | PRN
  Administered 2023-08-07: 1

## 2023-08-07 MED ORDER — NALOXONE HCL 0.4 MG/ML IJ SOLN: 0.4000 mg | INTRAMUSCULAR | Status: DC | PRN

## 2023-08-07 MED ORDER — SODIUM CHLORIDE 0.9% FLUSH: 3.0000 mL | INTRAVENOUS | Status: DC | PRN

## 2023-08-07 MED ORDER — FENTANYL CITRATE (PF) 100 MCG/2ML IJ SOLN
INTRAMUSCULAR | Status: AC
  Filled 2023-08-07: qty 2

## 2023-08-07 MED ORDER — PHENYLEPHRINE 80 MCG/ML (10ML) SYRINGE FOR IV PUSH (FOR BLOOD PRESSURE SUPPORT)
PREFILLED_SYRINGE | INTRAVENOUS | Status: AC
  Filled 2023-08-07: qty 10

## 2023-08-07 MED ORDER — PRENATAL MULTIVITAMIN CH
1.0000 | ORAL_TABLET | Freq: Every day | ORAL | Status: DC
  Administered 2023-08-08 – 2023-08-09 (×2): 1 via ORAL
  Filled 2023-08-07 (×2): qty 1

## 2023-08-07 MED ORDER — DIPHENHYDRAMINE HCL 25 MG PO CAPS: 25.0000 mg | ORAL_CAPSULE | ORAL | Status: DC | PRN

## 2023-08-07 MED ORDER — SIMETHICONE 80 MG PO CHEW
80.0000 mg | CHEWABLE_TABLET | Freq: Three times a day (TID) | ORAL | Status: DC
  Administered 2023-08-07 – 2023-08-09 (×6): 80 mg via ORAL
  Filled 2023-08-07 (×6): qty 1

## 2023-08-07 MED ORDER — ONDANSETRON HCL 4 MG/2ML IJ SOLN
INTRAMUSCULAR | Status: AC
  Filled 2023-08-07: qty 2

## 2023-08-07 MED ORDER — METOCLOPRAMIDE HCL 5 MG/ML IJ SOLN
INTRAMUSCULAR | Status: AC
  Filled 2023-08-07: qty 2

## 2023-08-07 MED ORDER — LACTATED RINGERS IV SOLN: INTRAVENOUS | Status: DC

## 2023-08-07 MED ORDER — ACETAMINOPHEN 10 MG/ML IV SOLN
INTRAVENOUS | Status: AC
Start: 2023-08-07 — End: ?
  Filled 2023-08-07: qty 100

## 2023-08-07 MED ORDER — COCONUT OIL OIL: 1.0000 | TOPICAL_OIL | Status: DC | PRN

## 2023-08-07 MED ORDER — FENTANYL CITRATE (PF) 100 MCG/2ML IJ SOLN: 25.0000 ug | INTRAMUSCULAR | Status: DC | PRN

## 2023-08-07 MED ORDER — FENTANYL CITRATE (PF) 100 MCG/2ML IJ SOLN
INTRAMUSCULAR | Status: DC | PRN
  Administered 2023-08-07: 15 ug via INTRATHECAL

## 2023-08-07 MED ORDER — ZOLPIDEM TARTRATE 5 MG PO TABS: 5.0000 mg | ORAL_TABLET | Freq: Every evening | ORAL | Status: DC | PRN

## 2023-08-07 MED ORDER — DIPHENHYDRAMINE HCL 25 MG PO CAPS: 25.0000 mg | ORAL_CAPSULE | Freq: Four times a day (QID) | ORAL | Status: DC | PRN

## 2023-08-07 MED ORDER — MEPERIDINE HCL 25 MG/ML IJ SOLN: 6.2500 mg | INTRAMUSCULAR | Status: DC | PRN

## 2023-08-07 MED ORDER — WITCH HAZEL-GLYCERIN EX PADS: 1.0000 | MEDICATED_PAD | CUTANEOUS | Status: DC | PRN

## 2023-08-07 MED ORDER — PHENYLEPHRINE HCL-NACL 20-0.9 MG/250ML-% IV SOLN
INTRAVENOUS | Status: AC
  Filled 2023-08-07: qty 250

## 2023-08-07 MED ORDER — ONDANSETRON HCL 4 MG/2ML IJ SOLN
INTRAMUSCULAR | Status: DC | PRN
  Administered 2023-08-07: 4 mg via INTRAVENOUS

## 2023-08-07 MED ORDER — DEXAMETHASONE SODIUM PHOSPHATE 4 MG/ML IJ SOLN
INTRAMUSCULAR | Status: AC
  Filled 2023-08-07: qty 2

## 2023-08-07 MED ORDER — DIBUCAINE (PERIANAL) 1 % EX OINT: 1.0000 | TOPICAL_OINTMENT | CUTANEOUS | Status: DC | PRN

## 2023-08-07 MED ORDER — KETOROLAC TROMETHAMINE 30 MG/ML IJ SOLN: 30.0000 mg | Freq: Four times a day (QID) | INTRAMUSCULAR | Status: AC | PRN

## 2023-08-07 MED ORDER — OXYTOCIN-SODIUM CHLORIDE 30-0.9 UT/500ML-% IV SOLN
2.5000 [IU]/h | INTRAVENOUS | Status: AC
  Administered 2023-08-07: 2.5 [IU]/h via INTRAVENOUS
  Filled 2023-08-07: qty 500

## 2023-08-07 MED ORDER — MORPHINE SULFATE (PF) 0.5 MG/ML IJ SOLN
INTRAMUSCULAR | Status: AC
  Filled 2023-08-07: qty 10

## 2023-08-07 MED ORDER — LACTATED RINGERS IV SOLN: INTRAVENOUS | Status: AC

## 2023-08-07 MED ORDER — ONDANSETRON HCL 4 MG/2ML IJ SOLN: 4.0000 mg | Freq: Three times a day (TID) | INTRAMUSCULAR | Status: DC | PRN

## 2023-08-07 MED ORDER — OXYTOCIN-SODIUM CHLORIDE 30-0.9 UT/500ML-% IV SOLN
INTRAVENOUS | Status: DC | PRN
  Administered 2023-08-07: 200 mL via INTRAVENOUS

## 2023-08-07 MED ORDER — ACETAMINOPHEN 500 MG PO TABS
ORAL_TABLET | ORAL | Status: AC
Start: 2023-08-07 — End: ?
  Filled 2023-08-07: qty 2

## 2023-08-07 MED ORDER — SIMETHICONE 80 MG PO CHEW: 80.0000 mg | CHEWABLE_TABLET | ORAL | Status: DC | PRN

## 2023-08-07 MED ORDER — ACETAMINOPHEN 500 MG PO TABS
1000.0000 mg | ORAL_TABLET | ORAL | Status: AC
  Administered 2023-08-07: 1000 mg via ORAL

## 2023-08-07 MED ORDER — MORPHINE SULFATE (PF) 0.5 MG/ML IJ SOLN
INTRAMUSCULAR | Status: DC | PRN
  Administered 2023-08-07: 150 ug via INTRATHECAL

## 2023-08-07 MED ORDER — SCOPOLAMINE 1 MG/3DAYS TD PT72
1.0000 | MEDICATED_PATCH | TRANSDERMAL | Status: DC
  Administered 2023-08-07: 1.5 mg via TRANSDERMAL

## 2023-08-07 MED ORDER — DIPHENHYDRAMINE HCL 50 MG/ML IJ SOLN: 12.5000 mg | INTRAMUSCULAR | Status: DC | PRN

## 2023-08-07 MED ORDER — DEXAMETHASONE SODIUM PHOSPHATE 10 MG/ML IJ SOLN
INTRAMUSCULAR | Status: DC | PRN
  Administered 2023-08-07: 10 mg via INTRAVENOUS

## 2023-08-07 MED ORDER — CEFAZOLIN SODIUM-DEXTROSE 2-4 GM/100ML-% IV SOLN
INTRAVENOUS | Status: AC
  Filled 2023-08-07: qty 100

## 2023-08-07 SURGICAL SUPPLY — 33 items
BENZOIN TINCTURE PRP APPL 2/3 (GAUZE/BANDAGES/DRESSINGS) IMPLANT
CHLORAPREP W/TINT 26 (MISCELLANEOUS) ×2 IMPLANT
CLAMP UMBILICAL CORD (MISCELLANEOUS) ×1 IMPLANT
CLOTH BEACON ORANGE TIMEOUT ST (SAFETY) ×1 IMPLANT
DRAPE C SECTION CLR SCREEN (DRAPES) ×1 IMPLANT
DRSG OPSITE POSTOP 4X10 (GAUZE/BANDAGES/DRESSINGS) ×1 IMPLANT
ELECT REM PT RETURN 9FT ADLT (ELECTROSURGICAL) ×1 IMPLANT
ELECTRODE REM PT RTRN 9FT ADLT (ELECTROSURGICAL) ×1 IMPLANT
EXTRACTOR VACUUM KIWI (MISCELLANEOUS) IMPLANT
GLOVE BIO SURGEON STRL SZ 6.5 (GLOVE) ×1 IMPLANT
GLOVE BIOGEL PI IND STRL 7.0 (GLOVE) ×2 IMPLANT
GOWN STRL REUS W/TWL LRG LVL3 (GOWN DISPOSABLE) ×2 IMPLANT
KIT ABG SYR 3ML LUER SLIP (SYRINGE) IMPLANT
MAT PREVALON FULL STRYKER (MISCELLANEOUS) IMPLANT
NDL HYPO 25X5/8 SAFETYGLIDE (NEEDLE) IMPLANT
NEEDLE HYPO 25X5/8 SAFETYGLIDE (NEEDLE) IMPLANT
NS IRRIG 1000ML POUR BTL (IV SOLUTION) ×1 IMPLANT
PACK C SECTION WH (CUSTOM PROCEDURE TRAY) ×1 IMPLANT
PAD OB MATERNITY 4.3X12.25 (PERSONAL CARE ITEMS) ×1 IMPLANT
RETRACTOR WND ALEXIS 25 LRG (MISCELLANEOUS) ×1 IMPLANT
RTRCTR C-SECT PINK 25CM LRG (MISCELLANEOUS) IMPLANT
RTRCTR WOUND ALEXIS 25CM LRG (MISCELLANEOUS) ×1 IMPLANT
STRIP CLOSURE SKIN 1/2X4 (GAUZE/BANDAGES/DRESSINGS) IMPLANT
SUT CHROMIC 1 CTX 36 (SUTURE) ×2 IMPLANT
SUT PLAIN 0 NONE (SUTURE) IMPLANT
SUT PLAIN 2 0 XLH (SUTURE) ×1 IMPLANT
SUT VIC AB 0 CT1 27XBRD ANBCTR (SUTURE) ×2 IMPLANT
SUT VIC AB 2-0 CT1 TAPERPNT 27 (SUTURE) ×1 IMPLANT
SUT VIC AB 3-0 CT1 TAPERPNT 27 (SUTURE) IMPLANT
SUT VIC AB 4-0 KS 27 (SUTURE) ×1 IMPLANT
TOWEL OR 17X24 6PK STRL BLUE (TOWEL DISPOSABLE) ×1 IMPLANT
TRAY FOLEY W/BAG SLVR 14FR LF (SET/KITS/TRAYS/PACK) ×1 IMPLANT
WATER STERILE IRR 1000ML POUR (IV SOLUTION) ×1 IMPLANT

## 2023-08-07 NOTE — Transfer of Care (Signed)
 Immediate Anesthesia Transfer of Care Note  Patient: Shelley Walker  Procedure(s) Performed: CESAREAN DELIVERY  Patient Location: PACU  Anesthesia Type:Spinal  Level of Consciousness: awake, alert , and oriented  Airway & Oxygen Therapy: Patient Spontanous Breathing  Post-op Assessment: Report given to RN and Post -op Vital signs reviewed and stable  Post vital signs: Reviewed and stable  Last Vitals:  Vitals Value Taken Time  BP 106/63 08/07/23 1032  Temp    Pulse 59 08/07/23 1033  Resp    SpO2 98 % 08/07/23 1033  Vitals shown include unfiled device data.  Last Pain:  Vitals:   08/07/23 0851  PainSc: 0-No pain         Complications: No notable events documented.

## 2023-08-07 NOTE — Anesthesia Postprocedure Evaluation (Signed)
 Anesthesia Post Note  Patient: Shelley Walker  Procedure(s) Performed: CESAREAN DELIVERY     Patient location during evaluation: PACU Anesthesia Type: Spinal Level of consciousness: oriented and awake and alert Pain management: pain level controlled Vital Signs Assessment: post-procedure vital signs reviewed and stable Respiratory status: spontaneous breathing, respiratory function stable and nonlabored ventilation Cardiovascular status: blood pressure returned to baseline and stable Postop Assessment: no headache, no backache, no apparent nausea or vomiting, spinal receding and patient able to bend at knees Anesthetic complications: no   No notable events documented.  Last Vitals:  Vitals:   08/07/23 1100 08/07/23 1115  BP: 104/63 (!) 101/58  Pulse: 68 (!) 52  Resp: 14 11  Temp:    SpO2: 99% 100%    Last Pain:  Vitals:   08/07/23 1032  TempSrc: Oral  PainSc:    Pain Goal:    LLE Motor Response: Purposeful movement (08/07/23 1115) LLE Sensation: Tingling (08/07/23 1115) RLE Motor Response: Purposeful movement (08/07/23 1115) RLE Sensation: Tingling (08/07/23 1115) L Sensory Level: T10-Umbilical region (08/07/23 1115) R Sensory Level: T10-Umbilical region (08/07/23 1115) Epidural/Spinal Function Cutaneous sensation: Tingles (08/07/23 1115), Patient able to flex knees: No (08/07/23 1115), Patient able to lift hips off bed: No (08/07/23 1115), Back pain beyond tenderness at insertion site: No (08/07/23 1115), Progressively worsening motor and/or sensory loss: No (08/07/23 1115), Bowel and/or bladder incontinence post epidural: No (08/07/23 1115)  Arvie Bartholomew A.

## 2023-08-07 NOTE — Anesthesia Procedure Notes (Deleted)
 Spinal  Patient location during procedure: OR Start time: 08/07/2023 9:17 AM End time: 08/07/2023 9:20 AM Reason for block: surgical anesthesia Staffing Performed: anesthesiologist  Anesthesiologist: Mal Amabile, MD Performed by: Graciela Husbands, CRNA Authorized by: Mal Amabile, MD   Preanesthetic Checklist Completed: patient identified, IV checked, site marked, risks and benefits discussed, surgical consent, monitors and equipment checked, pre-op evaluation and timeout performed Spinal Block Patient position: sitting Prep: DuraPrep and site prepped and draped Patient monitoring: heart rate, cardiac monitor, continuous pulse ox and blood pressure Approach: midline Location: L4-5 Injection technique: single-shot Needle Needle type: Pencan  Needle gauge: 24 G Needle length: 9 cm Needle insertion depth: 5 cm Assessment Sensory level: T4 Events: CSF return Additional Notes Patient tolerated procedure well. Adequate sensory level.

## 2023-08-07 NOTE — Anesthesia Procedure Notes (Signed)
 Spinal  Patient location during procedure: OR Start time: 08/07/2023 9:17 AM End time: 08/07/2023 9:20 AM Reason for block: surgical anesthesia Staffing Performed: anesthesiologist  Anesthesiologist: Mal Amabile, MD Performed by: Mal Amabile, MD Authorized by: Mal Amabile, MD   Preanesthetic Checklist Completed: patient identified, IV checked, site marked, risks and benefits discussed, surgical consent, monitors and equipment checked, pre-op evaluation and timeout performed Spinal Block Patient position: sitting Prep: DuraPrep and site prepped and draped Patient monitoring: heart rate, cardiac monitor, continuous pulse ox and blood pressure Approach: midline Location: L4-5 Injection technique: single-shot Needle Needle type: Pencan  Needle gauge: 24 G Needle length: 9 cm Needle insertion depth: 5 cm Assessment Sensory level: T4 Events: CSF return Additional Notes Patient tolerated procedure well. Adequate sensory level.

## 2023-08-07 NOTE — Op Note (Signed)
 Operative Note    Preoperative Diagnosis IUP at 39 2/7wks  Previous cesarean section  History of factor V leiden deficiency    Postoperative Diagnosis: Same    Procedure: Repeat low transverse cesarean section with no extensions and double layered closure.    Surgeon: Britt Bottom DO  Assist: Teressa Lower MD  An experienced assistant was required given the standard of surgical care given the complexity of the case and maternal body habitus.  This assistant was needed for exposure, dissection, suctioning, retraction, instrument exchange, assisting with delivery with administration of fundal pressure, and for overall help during the procedure.  Anesthesia: Spinal   Fluids: LR 2L EBL: UOP: IVF: Ancef. TXA   Findings: Viable female infant in vertex position, APGARS 9/9, Wt 9lbs 15oz                  Grossly normal uterus, tubes and ovaries    Specimen: Placenta to L/D   Procedure Note :  Consent verified pre-op. All questions answered   Patient was taken to the operating room where spinal anesthesia was administered. She was prepped and draped in the normal sterile fashion and placed in the dorsal supine position with a leftward tilt. An appropriate time out was performed.  A Pfannenstiel skin incision was then made, previous scar revised and the incision was carried through to the underlying layer of fascia by sharp dissection and Bovie cautery. The fascia was nicked in the midline and the incision was extended laterally with Mayo scissors.  The superior and inferior aspects of the incision were grasped with kocher clamps and dissected off the underlying rectus muscles.  The rectus muscles were separated in the midline  and the peritoneal cavity entered bluntly. The peritoneal incision was then extended both superiorly and inferiorly with careful attention to avoid both bowel and bladder. The Alexis self-retaining wound retractor was then placed and the lower uterine segment  exposed. The bladder flap was developed with Metzenbaum scissors and pushed away from the lower uterine segment.  The lower uterine segment was then incised in a transverse fashion and the cavity itself entered bluntly. The infant's head was then lifted and delivered from the incision without difficulty. Vigorous spontaneous cry was noted during delivery.  The remainder of the infant delivered easily and the nose and mouth were bulb suctioned. The cord was clamped and cut after a minute delay. The infant was handed off to the waiting NICU team. Cord blood was obtained The placenta was then spontaneously expressed from the uterus and the uterus cleared of all clots and debris with moist lap sponge. The uterine incision was then repaired in 2 layers the first layer was a running locked layer 1-0 chromic and the second an imbricating layer of the same suture. The tubes and ovaries were inspected and the gutters cleared of all clots and debris. The uterine incision was inspected and found to be hemostatic.  All instruments and sponges were then removed from the abdomen. The peritoneum was then reapproximated in a running fashion with sutures of 2-0 Vicryl.  The fascia was then closed with 0 Vicryl in a running fashion. Subcutaneous tissue was reapproximated with 3-0 plain in a running fashion. The skin was closed with a subcuticular stitch of 4-0 Vicryl on a Keith needle and then reinforced with benzoin, steri-strips and a honeycomb dressing.  At the conclusion of the procedure all instruments and sponge counts were correct. Patient was taken to the recovery room in good  condition with her baby accompanying her skin to skin.

## 2023-08-07 NOTE — Lactation Note (Signed)
 This note was copied from a baby's chart. Lactation Consultation Note  Patient Name: Shelley Walker WNUUV'O Date: 08/07/2023 Age:33 hours Reason for consult: Initial assessment (See MOB MR: C/S delivery), LGA infant greater than 9 lbs at birth.  LC entered the room, MOB finished BF infant for 10 minutes,. MOB feels BF is going well and infant is latching without difficulties, she is only feeling  a tug with latch, no pain nor discomfort. MOB knows if infant is still cuing after BF on the first breast to offer the 2nd breast during the same feeding. LC reviewed hand expression using breast model and MOB easily expressed 2 mls of colostrum that was spoon fed to infant. LC discussed infant's input and output and infant had a void since birth. MOB will continue to BF infant by cues, on demand, every 2- 3 hours, skin  to skin. MOB knows to call if she has any BF questions, concerns or needs latch assistance. See flow sheet for recently feeding. MOB was  made aware of O/P services, breastfeeding support groups, community resources, and our phone # for post-discharge questions.    Maternal Data Has patient been taught Hand Expression?: Yes Does the patient have breastfeeding experience prior to this delivery?: Yes How long did the patient breastfeed?: Per MOB, she BF 1st child for 3 months, initally she had latch challenges but breastfeeding improved. Her son is now 79 years of age.  Feeding Mother's Current Feeding Choice: Breast Milk  LATCH Score  LC did not observe latch due infant BF less than 10 minutes  prior to [facility] entering the room, RN on MBU assisted with latch.                   Lactation Tools Discussed/Used    Interventions Interventions: Breast feeding basics reviewed;Skin to skin;Position options;Education  Discharge Pump: DEBP;Personal  Consult Status Consult Status: Follow-up Date: 08/08/23 Follow-up type: In-patient    Frederico Hamman 08/07/2023, 4:02  PM

## 2023-08-07 NOTE — H&P (Signed)
 Shelley Walker is a 33 y.o. female presenting at 48 2/7wks here for repeat cesarean section.  She has a factor V leiden deficiency - was on 81mg  asa  GBS neg  OB History     Gravida  3   Para  1   Term  1   Preterm      AB  1   Living  1      SAB  1   IAB      Ectopic      Multiple  0   Live Births  1          Past Medical History:  Diagnosis Date   Factor V Leiden (HCC)    Indication for care in labor or delivery 10/06/2020   Status post primary low transverse cesarean section 10/08/2020   Past Surgical History:  Procedure Laterality Date   CESAREAN SECTION N/A 10/08/2020   Procedure: CESAREAN SECTION;  Surgeon: Edwinna Areola, DO;  Location: [facility];  Service: Obstetrics;  Laterality: N/A;   EYE SURGERY     age 10   WISDOM TOOTH EXTRACTION  2010   Family History: family history is not on file. Social History:  reports that she has never smoked. She has never used smokeless tobacco. She reports that she does not drink alcohol and does not use drugs.     Maternal Diabetes: No Genetic Screening: Declined Maternal Ultrasounds/Referrals: Normal Fetal Ultrasounds or other Referrals:  None Maternal Substance Abuse:  No Significant Maternal Medications:  None Significant Maternal Lab Results:  Group B Strep negative Number of Prenatal Visits:greater than 3 verified prenatal visits Maternal Vaccinations:RSV: Given during pregnancy >/=14 days ago, TDap, and Flu Other Comments:  None  Review of Systems  Constitutional:  Negative for activity change and fatigue.  Eyes:  Negative for photophobia and visual disturbance.  Respiratory:  Negative for chest tightness and shortness of breath.   Cardiovascular:  Positive for leg swelling. Negative for chest pain and palpitations.  Gastrointestinal:  Positive for abdominal pain.  Genitourinary:  Negative for pelvic pain.  Musculoskeletal:  Negative for back pain.  Neurological:  Negative for  light-headedness and headaches.  Psychiatric/Behavioral:  The patient is not nervous/anxious.    Maternal Medical History:  Reason for admission: Repeat cesarean section   Contractions: Frequency: rare.   Perceived severity is mild.   Fetal activity: Perceived fetal activity is normal.   Prenatal complications: no prenatal complications Prenatal Complications - Diabetes: none.     There were no vitals taken for this visit. Maternal Exam:  Uterine Assessment: Contraction strength is mild.  Contraction frequency is regular and rare.  Abdomen: Patient reports no abdominal tenderness. Estimated fetal weight is AGA.   Fetal presentation: vertex Introitus: Normal vulva. Vulva is negative for condylomata and lesion.  Normal vagina.  Vagina is negative for condylomata.  Cervix: not evaluated.   Fetal Exam Fetal Monitor Review: Baseline rate: 125.    Physical Exam Constitutional:      Appearance: Normal appearance. She is normal weight.  Cardiovascular:     Rate and Rhythm: Normal rate.     Pulses: Normal pulses.  Pulmonary:     Effort: Pulmonary effort is normal.  Genitourinary:    General: Normal vulva.  Vulva is no lesion.  Musculoskeletal:        General: Normal range of motion.     Cervical back: Normal range of motion.  Skin:    General: Skin is warm and dry.  Capillary Refill: Capillary refill takes 2 to 3 seconds.  Neurological:     General: No focal deficit present.     Mental Status: She is alert and oriented to person, place, and time. Mental status is at baseline.  Psychiatric:        Mood and Affect: Mood normal.        Behavior: Behavior normal.        Thought Content: Thought content normal.        Judgment: Judgment normal.     Prenatal labs: ABO, Rh: --/--/O POS Performed at [facility], [address], [place], [place] [address]  (03/14 1014) Antibody: NEG Performed at [facility], [address], [place], [place] [address]   [phone]) Rubella: Immune (08/28 0000) RPR: NON REACTIVE (03/14 1010)  HBsAg: Negative (08/28 0000)  HIV: Non-reactive (08/28 0000)  GBS:     Assessment/Plan: 33yo [id number] female at 7 2/7wks for repeat cesarean section - Admit  - ERAS protocol  - Consent verified - To OR when ready    Shelley Walker 08/07/2023, 7:31 AM

## 2023-08-08 LAB — CBC
HCT: 31.7 % — ABNORMAL LOW (ref 36.0–46.0)
Hemoglobin: 10.7 g/dL — ABNORMAL LOW (ref 12.0–15.0)
MCH: 30.4 pg (ref 26.0–34.0)
MCHC: 33.8 g/dL (ref 30.0–36.0)
MCV: 90.1 fL (ref 80.0–100.0)
Platelets: 206 10*3/uL (ref 150–400)
RBC: 3.52 MIL/uL — ABNORMAL LOW (ref 3.87–5.11)
RDW: 13.8 % (ref 11.5–15.5)
WBC: 16.4 10*3/uL — ABNORMAL HIGH (ref 4.0–10.5)
nRBC: 0 % (ref 0.0–0.2)

## 2023-08-08 LAB — BIRTH TISSUE RECOVERY COLLECTION (PLACENTA DONATION)

## 2023-08-08 NOTE — Progress Notes (Signed)
 POSTPARTUM POSTOP PROGRESS NOTE  POD #1  Subjective:  No acute events overnight.  Pt denies problems with ambulating, voiding or po intake.  She denies nausea or vomiting.  Pain is well controlled.  She has not had flatus. She has not had bowel movement.  Lochia Minimal.   Objective: Blood pressure 103/60, pulse (!) 54, temperature 97.9 F (36.6 C), temperature source Oral, resp. rate 18, height 5\' 9"  (1.753 m), weight 85 kg, SpO2 99%, unknown if currently breastfeeding.  Physical Exam:  General: alert, cooperative and no distress Lochia:normal flow Chest: CTAB Heart: RRR no m/r/g Abdomen: +BS, soft, nontender Uterine Fundus: firm, 2cm below umbilicus. Honeycomb dressing intact, scant drainage Extremities: n edema, neg calf TTP BL, neg Homans BL  Recent Labs    08/08/23 0447  HGB 10.7*  HCT 31.7*    Assessment/Plan:  ASSESSMENT: Shelley Walker is a 33 y.o. [id number] s/p ERLTCS @ [redacted]w[redacted]d for h/o prior section. PNC c/b H/o FVL.   Plan for discharge tomorrow, Breastfeeding, and Lactation consult FVL Def - was on baby ASA pregnancy, continue SCDs in house   LOS: 1 day

## 2023-08-08 NOTE — Lactation Note (Signed)
 This note was copied from a baby's chart. Lactation Consultation Note  Patient Name: Shelley Walker [id number] Date: 08/08/2023 Age:33 hours Reason for consult: Follow-up assessment;Term;Infant weight loss (4 % weight loss, per mom baby recently fed . LC reviewed and updated the doc flow sheets per mom.) Last fed at 4 pm for 15 mins . Baby asleep at present   Maternal Data Has patient been taught Hand Expression?: Yes  Feeding Mother's Current Feeding Choice: Breast Milk     Lactation Tools Discussed/Used  None needed at this time   Interventions Interventions: Breast feeding basics reviewed;Education;LC Services brochure  Discharge Pump: Personal;DEBP  Consult Status Consult Status: Follow-up Date: 08/09/23 Follow-up type: In-patient    Matilde Sprang Malene Blaydes 08/08/2023, 5:40 PM

## 2023-08-09 ENCOUNTER — Other Ambulatory Visit (HOSPITAL_COMMUNITY): Payer: Self-pay

## 2023-08-09 MED ORDER — OXYCODONE HCL 5 MG PO TABS
5.0000 mg | ORAL_TABLET | ORAL | 0 refills | Status: AC | PRN
  Filled 2023-08-09: qty 10, 2d supply, fill #0

## 2023-08-09 MED ORDER — IBUPROFEN 600 MG PO TABS
600.0000 mg | ORAL_TABLET | Freq: Four times a day (QID) | ORAL | 1 refills | Status: AC | PRN
  Filled 2023-08-09: qty 60, 15d supply, fill #0

## 2023-08-09 MED ORDER — ACETAMINOPHEN 325 MG PO TABS: 650.0000 mg | ORAL_TABLET | Freq: Four times a day (QID) | ORAL | Status: AC | PRN

## 2023-08-09 NOTE — Discharge Summary (Signed)
 Postpartum Discharge Summary  Date of Service updated 08/09/23       Patient Name: Shelley Walker DOB: Mar 11, 1991 MRN: [id number]  Date of admission: 08/07/2023 Delivery date:08/07/2023 Delivering provider: Edwinna Areola Date of discharge: 08/09/2023  Admitting diagnosis: Previous cesarean delivery affecting pregnancy [O34.219] Intrauterine pregnancy: [redacted]w[redacted]d     Secondary diagnosis:  Principal Problem:   Previous cesarean delivery affecting pregnancy  Additional problems: Factor V Leiden mutation    Discharge diagnosis: Term Pregnancy Delivered                                              Post partum procedures: none Augmentation: N/A Complications: None  Hospital course: Sceduled C/S   33 y.o. yo [id number] at [redacted]w[redacted]d was admitted to the hospital 08/07/2023 for scheduled cesarean section with the following indication:Elective Repeat.Delivery details are as follows:  Membrane Rupture Time/Date: 9:47 AM,08/07/2023  Delivery Method:C-Section, Low Transverse Operative Delivery:N/A Details of operation can be found in separate operative note.  Patient had a postpartum course complicated by nothing.  She is ambulating, tolerating a regular diet, passing flatus, and urinating well. Patient is discharged home in stable condition on  08/09/23        Newborn Data: Birth date:08/07/2023 Birth time:9:48 AM Gender:Female Living status:Living Apgars:9 ,9  Weight:4500 g    Magnesium Sulfate received: No BMZ received: No Rhophylac:N/A MMR:N/A T-DaP:Given prenatally Flu: Yes RSV Vaccine received: Yes Transfusion:No Immunizations administered: Immunization History  Administered Date(s) Administered   Influenza Inj Mdck Quad Pf 02/12/2020   MMR 10/11/2020   Tdap 07/16/2020    Physical exam  Vitals:   08/08/23 0515 08/08/23 1501 08/08/23 2027 08/09/23 0515  BP: 103/60 112/67 (!) 107/57 115/73  Pulse: (!) 54 (!) 52 (!) 56 (!) 50  Resp: 18 17 18 18   Temp: 97.9 F (36.6  C) 97.6 F (36.4 C) 98.3 F (36.8 C) 97.9 F (36.6 C)  TempSrc: Oral Oral Axillary Axillary  SpO2: 99% 100%  98%  Weight:      Height:       General: alert, cooperative, and no distress Lochia: appropriate Uterine Fundus: firm Incision: Healing well with no significant drainage DVT Evaluation: No evidence of DVT seen on physical exam. Labs: Lab Results  Component Value Date   WBC 16.4 (H) 08/08/2023   HGB 10.7 (L) 08/08/2023   HCT 31.7 (L) 08/08/2023   MCV 90.1 08/08/2023   PLT 206 08/08/2023      Latest Ref Rng & Units 04/02/2022    8:56 PM  CMP  Glucose 70 - 99 mg/dL 253   BUN 6 - 20 mg/dL 14   Creatinine 6.64 - 1.00 mg/dL 4.03   Sodium 474 - 259 mmol/L 137   Potassium 3.5 - 5.1 mmol/L 4.0   Chloride 98 - 111 mmol/L 99   CO2 22 - 32 mmol/L 29   Calcium 8.9 - 10.3 mg/dL 56.3   Total Protein 6.5 - 8.1 g/dL 8.4   Total Bilirubin 0.3 - 1.2 mg/dL 0.7   Alkaline Phos 38 - 126 U/L 64   AST 15 - 41 U/L 67   ALT 0 - 44 U/L 52    Edinburgh Score:    08/08/2023    5:42 PM  Edinburgh Postnatal Depression Scale Screening Tool  I have been able to laugh and see the funny side of things.  0  I have looked forward with enjoyment to things. 0  I have blamed myself unnecessarily when things went wrong. 1  I have been anxious or worried for no good reason. 1  I have felt scared or panicky for no good reason. 0  Things have been getting on top of me. 0  I have been so unhappy that I have had difficulty sleeping. 0  I have felt sad or miserable. 0  I have been so unhappy that I have been crying. 0  The thought of harming myself has occurred to me. 0  Edinburgh Postnatal Depression Scale Total 2      After visit meds:  Allergies as of 08/09/2023   No Known Allergies      Medication List     STOP taking these medications    aspirin EC 81 MG tablet       TAKE these medications    acetaminophen 325 MG tablet Commonly known as: TYLENOL Take 2 tablets (650 mg  total) by mouth every 6 (six) hours as needed for mild pain (pain score 1-3) or moderate pain (pain score 4-6).   ibuprofen 600 MG tablet Commonly known as: ADVIL Take 1 tablet (600 mg total) by mouth every 6 (six) hours as needed.   oxyCODONE 5 MG immediate release tablet Commonly known as: Oxy IR/ROXICODONE Take 1 tablet (5 mg total) by mouth every 4 (four) hours as needed for moderate pain (pain score 4-6).   prenatal multivitamin Tabs tablet Take 1 tablet by mouth daily.         Discharge home in stable condition Infant Feeding: Breast Infant Disposition:home with mother Discharge instruction: per After Visit Summary and Postpartum booklet. Activity: Advance as tolerated. Pelvic rest for 6 weeks.  Diet: routine diet Anticipated Birth Control: Unsure Postpartum Appointment:6 weeks Additional Postpartum F/U: Incision check 1 week Future Appointments:No future appointments. Follow up Visit:  Follow-up Information     [facility]. Schedule an appointment as soon as possible for a visit in 1 week(s).   Contact information: [address]  [address] [place] [address] [phone]                     08/09/2023 [name], MD

## 2023-08-09 NOTE — Lactation Note (Signed)
 This note was copied from a baby's chart. Lactation Consultation Note  Patient Name: Shelley Walker [id number]'[id number] Date: 08/09/2023 Age:33 hours Reason for consult: Follow-up assessment;Term;Infant weight loss  P2, Baby has been frequently feeding for 10 min. Suggest offering second breast with each feeding and wait until baby is 37 weeks old to introduce pacifier.  Feed on demand with cues.  Goal 8-12+ times per day after first 24 hrs.   Reviewed engorgement care and monitoring voids/stools.   Maternal Data Has patient been taught Hand Expression?: Yes Does the patient have breastfeeding experience prior to this delivery?: Yes  Feeding Mother's Current Feeding Choice: Breast Milk   Interventions Interventions: Breast feeding basics reviewed;Education  Discharge Discharge Education: Engorgement and breast care;Warning signs for feeding baby  Consult Status Consult Status: Complete Date: 08/09/23  Hardie Pulley  RN, IBCLC 08/09/2023, 12:01 PM

## 2023-08-09 NOTE — Progress Notes (Signed)
 Subjective: Postpartum Day 2: Cesarean Delivery Patient is doing well this morning. Pain is controlled. Ambulating, voiding, tolerating PO. Minimal lochia. Breastfeeding.   Objective: Patient Vitals for the past 24 hrs:  BP Temp Temp src Pulse Resp SpO2  08/09/23 0515 115/73 97.9 F (36.6 C) Axillary (!) 50 18 98 %  08/08/23 2027 (!) 107/57 98.3 F (36.8 C) Axillary (!) 56 18 --  08/08/23 1501 112/67 97.6 F (36.4 C) Oral (!) 52 17 100 %    Physical Exam:  General: alert, cooperative, and no distress Lochia: appropriate Uterine Fundus: firm Incision: healing well, no significant drainage, no dehiscence, no significant erythema DVT Evaluation: No evidence of DVT seen on physical exam.  Recent Labs    08/08/23 0447  HGB 10.7*  HCT 31.7*    Assessment/Plan: Shelley Walker [id number] POD#2 sp RCS at [redacted]w[redacted]d 1. PPC: routine PP care 2. Rh pos 3. Dispo: meeting milestones, stable for discharge home. Instructions reviewed.   [name] 08/09/2023, 8:50 AM

## 2023-08-17 ENCOUNTER — Telehealth (HOSPITAL_COMMUNITY): Payer: Self-pay | Admitting: *Deleted

## 2023-08-17 NOTE — Telephone Encounter (Signed)
 08/17/2023  Name: KYLANI WIRES MRN: [id number] DOB: 07-12-1990  Reason for Call:  Transition of Care Hospital Discharge Call  Contact Status: Patient Contact Status: Message  Language assistant needed:          Follow-Up Questions:    Inocente Salles Postnatal Depression Scale:  In the Past 7 Days:    PHQ2-9 Depression Scale:     Discharge Follow-up:    Post-discharge interventions: NA  Salena Saner, RN 08/13/2023 12:26

## 2023-08-31 ENCOUNTER — Encounter (HOSPITAL_COMMUNITY): Payer: Self-pay | Admitting: Obstetrics and Gynecology

## 2023-08-31 NOTE — Progress Notes (Signed)
 Spoke w/ via phone for pre-op interview--- [facility] needs dos----   NONE. Surgeon orders requested 08/31/23.      Lab results------ COVID test -----patient states asymptomatic no test needed Arrive at -------0800 NPO after MN NO Solid Food.  Clear liquids from MN until---0700 Pre-Surgery Ensure or G2:  Med rec completed Medications to take morning of surgery -----Doxycycline Diabetic medication -----  GLP1 agonist last dose: GLP1 instructions:  Patient instructed no nail polish to be worn day of surgery Patient instructed to bring photo id and insurance card day of surgery Patient aware to have Driver (ride ) / caregiver    for 24 hours after surgery - Husband Lonni Fix Patient Special Instructions ----- Shower with antibacterial soap. Pre-Op special Instructions -----  Patient verbalized understanding of instructions that were given at this phone interview. Patient denies chest pain, sob, fever, cough at the interview.

## 2023-09-03 DIAGNOSIS — Z01818 Encounter for other preprocedural examination: Secondary | ICD-10-CM

## 2023-09-03 NOTE — H&P (Signed)
 Shelley Walker is an 33 y.o. female [phone].  She delivered her last child on 08/07/23 via cesarean section. Pt reports did well till a few nights ago when bleeding resumed and started waking up with chills. She denies any associated fever, cramping, discomfort or malaise. She had an US  done on 08/31/23 and a 3cm area suspicious for retained POC was noted in uterus.  Pt was offered a suction D&C under ultrasound guidance, She opted to have done after familial obligations over the weekend. She was given doxycycline and zofran use take while waiting on surgery  Pertinent Gynecological History: Menses:  intermittent lochia  Bleeding: dysfunctional uterine bleeding Contraception: none DES exposure: denies Blood transfusions: none Sexually transmitted diseases: no past history Previous GYN Procedures:  c/s    Last pap: normal Date: 03/2018 OB History: G3, P2012   Menstrual History: Menarche age: 78 No LMP recorded (lmp unknown).    Past Medical History:  Diagnosis Date   Factor V Leiden [facility])    Indication for care in labor or delivery 10/06/2020   Status post primary low transverse cesarean section 10/08/2020    Past Surgical History:  Procedure Laterality Date   CESAREAN SECTION N/A 10/08/2020   Procedure: CESAREAN SECTION;  Surgeon: Loa Riling, DO;  Location: [facility] LD [facility]S;  Service: Obstetrics;  Laterality: N/A;   CESAREAN SECTION N/A 08/07/2023   Procedure: CESAREAN DELIVERY;  Surgeon: Loa Riling, DO;  Location: [facility] LD [facility]S;  Service: Obstetrics;  Laterality: N/A;  Request OB Fellow assist   EYE SURGERY     age 24   WISDOM TOOTH EXTRACTION  2010    Family History  Problem Relation Age of Onset   Diabetes Neg Hx    Stroke Neg Hx    Hypertension Neg Hx     Social History:  reports that she has never smoked. She has never used smokeless tobacco. She reports that she does not drink alcohol and does not use drugs.  Allergies: No Known Allergies  No  medications prior to admission.    Review of Systems  Constitutional:  Positive for chills. Negative for activity change, fatigue and fever.  Eyes:  Negative for visual disturbance.  Respiratory:  Negative for chest tightness and shortness of breath.   Cardiovascular:  Negative for chest pain, palpitations and leg swelling.  Gastrointestinal:  Negative for abdominal distention, abdominal pain, nausea and vomiting.  Genitourinary:  Positive for vaginal bleeding. Negative for dysuria and pelvic pain.  Musculoskeletal:  Negative for myalgias.  Neurological:  Negative for light-headedness and headaches.  Psychiatric/Behavioral:  The patient is not nervous/anxious.     Height 5\' 9"  (1.753 m), weight 72.6 kg, currently breastfeeding. Physical Exam Constitutional:      Appearance: Normal appearance. She is normal weight.  Cardiovascular:     Pulses: Normal pulses.  Pulmonary:     Effort: Pulmonary effort is normal.  Abdominal:     General: Abdomen is flat. There is no distension.     Palpations: Abdomen is soft.     Tenderness: There is no abdominal tenderness.  Musculoskeletal:        General: Normal range of motion.     Cervical back: Normal range of motion.  Skin:    General: Skin is warm and dry.     Capillary Refill: Capillary refill takes 2 to 3 seconds.  Neurological:     General: No focal deficit present.     Mental Status: She is alert and oriented to person,  place, and time. Mental status is at baseline.  Psychiatric:        Mood and Affect: Mood normal.        Behavior: Behavior normal.        Thought Content: Thought content normal.        Judgment: Judgment normal.     No results found for this or any previous visit (from the past 24 hours).  No results found.  CBC done in office on 08/31/23 showed    9.5>14.2<390  Assessment/Plan: 33yo [id number] female presenting for US  guided suction D&E due to retained POC following a c/s ( POD #28) - Confirm consent  -  ERAS protocol  - To OR when ready   Lorrane Rosette Saahir Prude 09/03/2023, 10:20 PM

## 2023-09-04 ENCOUNTER — Encounter (HOSPITAL_COMMUNITY): Payer: Self-pay | Admitting: Obstetrics and Gynecology

## 2023-09-04 ENCOUNTER — Ambulatory Visit (HOSPITAL_COMMUNITY): Admitting: Anesthesiology

## 2023-09-04 ENCOUNTER — Other Ambulatory Visit: Payer: Self-pay

## 2023-09-04 ENCOUNTER — Ambulatory Visit (HOSPITAL_COMMUNITY)
Admission: RE | Admit: 2023-09-04 | Discharge: 2023-09-04 | Disposition: A | Discharge status: Home / Self Care | Source: Ambulatory Visit | Attending: Obstetrics and Gynecology | Admitting: Obstetrics and Gynecology

## 2023-09-04 ENCOUNTER — Ambulatory Visit (HOSPITAL_COMMUNITY)
Admission: RE | Admit: 2023-09-04 | Discharge: 2023-09-04 | Disposition: A | Discharge status: Home / Self Care | Attending: Obstetrics and Gynecology | Admitting: Obstetrics and Gynecology

## 2023-09-04 ENCOUNTER — Other Ambulatory Visit (HOSPITAL_COMMUNITY): Payer: Self-pay | Admitting: Obstetrics and Gynecology

## 2023-09-04 ENCOUNTER — Encounter (HOSPITAL_COMMUNITY)
Admission: RE | Disposition: A | Discharge status: Home / Self Care | Payer: Self-pay | Source: Home / Self Care | Attending: Obstetrics and Gynecology

## 2023-09-04 ENCOUNTER — Ambulatory Visit (HOSPITAL_BASED_OUTPATIENT_CLINIC_OR_DEPARTMENT_OTHER): Admitting: Anesthesiology

## 2023-09-04 DIAGNOSIS — O9913 Other diseases of the blood and blood-forming organs and certain disorders involving the immune mechanism complicating the puerperium: Secondary | ICD-10-CM | POA: Insufficient documentation

## 2023-09-04 DIAGNOSIS — D6851 Activated protein C resistance: Secondary | ICD-10-CM | POA: Diagnosis not present

## 2023-09-04 DIAGNOSIS — O029 Abnormal product of conception, unspecified: Secondary | ICD-10-CM

## 2023-09-04 DIAGNOSIS — Z3A00 Weeks of gestation of pregnancy not specified: Secondary | ICD-10-CM | POA: Diagnosis not present

## 2023-09-04 DIAGNOSIS — Z01818 Encounter for other preprocedural examination: Secondary | ICD-10-CM

## 2023-09-04 HISTORY — PX: OPERATIVE ULTRASOUND: SHX5996

## 2023-09-04 HISTORY — PX: DILATION AND CURETTAGE OF UTERUS: SHX78

## 2023-09-04 LAB — POCT PREGNANCY, URINE: Preg Test, Ur: NEGATIVE

## 2023-09-04 LAB — TYPE AND SCREEN
ABO/RH(D): O POS
Antibody Screen: NEGATIVE

## 2023-09-04 LAB — CBC
HCT: 39.6 % (ref 36.0–46.0)
Hemoglobin: 13.2 g/dL (ref 12.0–15.0)
MCH: 29.6 pg (ref 26.0–34.0)
MCHC: 33.3 g/dL (ref 30.0–36.0)
MCV: 88.8 fL (ref 80.0–100.0)
Platelets: 334 K/uL (ref 150–400)
RBC: 4.46 MIL/uL (ref 3.87–5.11)
RDW: 11.9 % (ref 11.5–15.5)
WBC: 7.3 K/uL (ref 4.0–10.5)
nRBC: 0 % (ref 0.0–0.2)

## 2023-09-04 SURGERY — DILATION AND CURETTAGE
Anesthesia: General

## 2023-09-04 MED ORDER — SCOPOLAMINE 1 MG/3DAYS TD PT72
MEDICATED_PATCH | TRANSDERMAL | Status: AC
  Filled 2023-09-04: qty 1

## 2023-09-04 MED ORDER — DEXAMETHASONE SODIUM PHOSPHATE 10 MG/ML IJ SOLN
INTRAMUSCULAR | Status: AC
  Filled 2023-09-04: qty 1

## 2023-09-04 MED ORDER — DOXYCYCLINE HYCLATE 100 MG IV SOLR
200.0000 mg | Freq: Once | INTRAVENOUS | Status: AC
  Administered 2023-09-04: 200 mg via INTRAVENOUS
  Filled 2023-09-04: qty 200

## 2023-09-04 MED ORDER — LACTATED RINGERS IV SOLN: INTRAVENOUS | Status: DC

## 2023-09-04 MED ORDER — KETOROLAC TROMETHAMINE 30 MG/ML IJ SOLN
INTRAMUSCULAR | Status: DC | PRN
  Administered 2023-09-04: 30 mg via INTRAVENOUS

## 2023-09-04 MED ORDER — CARBOPROST TROMETHAMINE 250 MCG/ML IM SOLN
INTRAMUSCULAR | Status: AC
  Filled 2023-09-04: qty 1

## 2023-09-04 MED ORDER — FENTANYL CITRATE (PF) 250 MCG/5ML IJ SOLN
INTRAMUSCULAR | Status: DC | PRN
Start: 2023-09-04 — End: 2023-09-04
  Administered 2023-09-04: 100 ug via INTRAVENOUS

## 2023-09-04 MED ORDER — POVIDONE-IODINE 10 % EX SWAB: 2.0000 | Freq: Once | CUTANEOUS | Status: DC

## 2023-09-04 MED ORDER — ORAL CARE MOUTH RINSE: 15.0000 mL | Freq: Once | OROMUCOSAL | Status: AC

## 2023-09-04 MED ORDER — EPHEDRINE SULFATE-NACL 50-0.9 MG/10ML-% IV SOSY
PREFILLED_SYRINGE | INTRAVENOUS | Status: DC | PRN
  Administered 2023-09-04: 10 mg via INTRAVENOUS

## 2023-09-04 MED ORDER — CHLORHEXIDINE GLUCONATE 0.12 % MT SOLN
15.0000 mL | Freq: Once | OROMUCOSAL | Status: AC
  Administered 2023-09-04: 15 mL via OROMUCOSAL

## 2023-09-04 MED ORDER — ACETAMINOPHEN 500 MG PO TABS
1000.0000 mg | ORAL_TABLET | Freq: Once | ORAL | Status: AC
  Administered 2023-09-04: 1000 mg via ORAL

## 2023-09-04 MED ORDER — SCOPOLAMINE 1 MG/3DAYS TD PT72
1.0000 | MEDICATED_PATCH | TRANSDERMAL | Status: DC
  Administered 2023-09-04: 1.5 mg via TRANSDERMAL

## 2023-09-04 MED ORDER — LIDOCAINE HCL 1 % IJ SOLN
INTRAMUSCULAR | Status: DC | PRN
  Administered 2023-09-04: 16 mL

## 2023-09-04 MED ORDER — LIDOCAINE 2% (20 MG/ML) 5 ML SYRINGE
INTRAMUSCULAR | Status: DC | PRN
  Administered 2023-09-04: 60 mg via INTRAVENOUS

## 2023-09-04 MED ORDER — 0.9 % SODIUM CHLORIDE (POUR BTL) OPTIME
TOPICAL | Status: DC | PRN
  Administered 2023-09-04: 1000 mL

## 2023-09-04 MED ORDER — LACTATED RINGERS IV SOLN
INTRAVENOUS | Status: DC
Start: 2023-09-04 — End: 2023-09-04

## 2023-09-04 MED ORDER — MIDAZOLAM HCL 2 MG/2ML IJ SOLN
INTRAMUSCULAR | Status: DC | PRN
  Administered 2023-09-04: 2 mg via INTRAVENOUS

## 2023-09-04 MED ORDER — FENTANYL CITRATE (PF) 250 MCG/5ML IJ SOLN
INTRAMUSCULAR | Status: AC
  Filled 2023-09-04: qty 5

## 2023-09-04 MED ORDER — DEXAMETHASONE SODIUM PHOSPHATE 10 MG/ML IJ SOLN
INTRAMUSCULAR | Status: DC | PRN
  Administered 2023-09-04: 10 mg via INTRAVENOUS

## 2023-09-04 MED ORDER — TRANEXAMIC ACID-NACL 1000-0.7 MG/100ML-% IV SOLN
INTRAVENOUS | Status: AC
Start: 2023-09-04 — End: ?
  Filled 2023-09-04: qty 100

## 2023-09-04 MED ORDER — MISOPROSTOL 200 MCG PO TABS
ORAL_TABLET | ORAL | Status: AC
  Filled 2023-09-04: qty 5

## 2023-09-04 MED ORDER — LIDOCAINE HCL 1 % IJ SOLN
INTRAMUSCULAR | Status: AC
  Filled 2023-09-04: qty 20

## 2023-09-04 MED ORDER — CHLORHEXIDINE GLUCONATE 0.12 % MT SOLN
OROMUCOSAL | Status: AC
  Filled 2023-09-04: qty 15

## 2023-09-04 MED ORDER — ONDANSETRON HCL 4 MG/2ML IJ SOLN
INTRAMUSCULAR | Status: AC
  Filled 2023-09-04: qty 2

## 2023-09-04 MED ORDER — ONDANSETRON HCL 4 MG/2ML IJ SOLN
INTRAMUSCULAR | Status: DC | PRN
  Administered 2023-09-04: 4 mg via INTRAVENOUS

## 2023-09-04 MED ORDER — PROPOFOL 10 MG/ML IV BOLUS
INTRAVENOUS | Status: AC
  Filled 2023-09-04: qty 20

## 2023-09-04 MED ORDER — LIDOCAINE 2% (20 MG/ML) 5 ML SYRINGE
INTRAMUSCULAR | Status: AC
  Filled 2023-09-04: qty 5

## 2023-09-04 MED ORDER — ACETAMINOPHEN 500 MG PO TABS
ORAL_TABLET | ORAL | Status: DC
Start: 2023-09-04 — End: 2023-09-04
  Filled 2023-09-04: qty 2

## 2023-09-04 MED ORDER — PROPOFOL 10 MG/ML IV BOLUS
INTRAVENOUS | Status: DC | PRN
  Administered 2023-09-04: 200 mg via INTRAVENOUS

## 2023-09-04 MED ORDER — METHYLERGONOVINE MALEATE 0.2 MG/ML IJ SOLN
INTRAMUSCULAR | Status: AC
  Filled 2023-09-04: qty 1

## 2023-09-04 MED ORDER — MIDAZOLAM HCL 2 MG/2ML IJ SOLN
INTRAMUSCULAR | Status: AC
  Filled 2023-09-04: qty 2

## 2023-09-04 SURGICAL SUPPLY — 24 items
CATH ROBINSON RED A/P 16FR (CATHETERS) ×1 IMPLANT
FILTER UTR ASPR ASSEMBLY (MISCELLANEOUS) ×1 IMPLANT
GLOVE BIO SURGEON STRL SZ 6 (GLOVE) ×1 IMPLANT
GLOVE BIOGEL PI IND STRL 6.5 (GLOVE) ×1 IMPLANT
GLOVE BIOGEL PI IND STRL 7.0 (GLOVE) ×1 IMPLANT
GLOVE BIOGEL PI IND STRL 7.5 (GLOVE) IMPLANT
GLOVE SURG SS PI 7.0 STRL IVOR (GLOVE) IMPLANT
GOWN STRL REUS W/ TWL LRG LVL3 (GOWN DISPOSABLE) ×2 IMPLANT
HOSE CONNECTING 18IN BERKELEY (TUBING) ×1 IMPLANT
KIT BERKELEY 1ST TRI 3/8 NO TR (MISCELLANEOUS) ×1 IMPLANT
KIT BERKELEY 1ST TRIMESTER 3/8 (MISCELLANEOUS) ×1 IMPLANT
NS IRRIG 1000ML POUR BTL (IV SOLUTION) ×1 IMPLANT
PACK VAGINAL MINOR WOMEN LF (CUSTOM PROCEDURE TRAY) ×1 IMPLANT
PAD OB MATERNITY 11 LF (PERSONAL CARE ITEMS) ×1 IMPLANT
SET BERKELEY SUCTION TUBING (SUCTIONS) ×1 IMPLANT
SOL PREP POV-IOD 4OZ 10% (MISCELLANEOUS) IMPLANT
SPIKE FLUID TRANSFER (MISCELLANEOUS) ×1 IMPLANT
TOWEL GREEN STERILE FF (TOWEL DISPOSABLE) ×2 IMPLANT
UNDERPAD 30X36 HEAVY ABSORB (UNDERPADS AND DIAPERS) ×1 IMPLANT
VACURETTE 10 RIGID CVD (CANNULA) IMPLANT
VACURETTE 6 ASPIR F TIP BERK (CANNULA) IMPLANT
VACURETTE 7MM CVD STRL WRAP (CANNULA) IMPLANT
VACURETTE 8 RIGID CVD (CANNULA) IMPLANT
VACURETTE 9 RIGID CVD (CANNULA) IMPLANT

## 2023-09-04 NOTE — Op Note (Signed)
 Preoperative Diagnosis: Retained products of conception Postoperative Diagnosis: Same as above Procedure: Suction dilation and curettage under ultrasound guidance Surgeon: Ethridge Herder, MD Anesthesia: LMA by Dr Wendy Hamel IVF: 1000cc UOP: voided prior to procedure EBL: 50cc   Findings: On bimanual exam, approx 12wk size uterus, mobile, retroverted: BL adenxa WNL. Uterus sounded to 12cm. Upon insertion of operative speculum, nullip appearing cervix. Post-procedure, fundus firm and more consistent with 9wk uterus   Specimen: Curettings to pathology Complications: none   Operative procedure: Pt was taken to operating room where LMA anesthesia was obtained without difficulty.  She was prepped and draped in the normal sterile fashion in the dorsal lithotomy position.  An appropriate timeout was performed.  A speculum was placed in the vagina and 2cc of 1% plain lidocaine injected into the anterior cervix.  An additional 9cc was placed both at 2 and 10 o'clock for a paracervical block. A single tooth tenaculum was placed on the anterior lip of the cervix. Cervix gently dilated past internal os using Pratt dilators (21). The 7mm suction currette was obtained and easily introduced into the fundus.  With 2 passes, a significant amount of tissue was obtained.  When no further tissue was noted, the suction was discontinued and a gentle currettage performed.  1 additional pass revealed no further tissue. The tenaculum was removed and pressure used for hemostasis. All instruments and sponges were removed from vagina and the patient awakened and taken to the PACU in good condition.  US  guidance used throughout procedure

## 2023-09-04 NOTE — Transfer of Care (Signed)
 Immediate Anesthesia Transfer of Care Note  Patient: Shelley Walker  Procedure(s) Performed: SUCTION DILATION AND CURETTAGE US  INTRAOPERATIVE  Patient Location: PACU  Anesthesia Type:General  Level of Consciousness: awake, alert , and sedated  Airway & Oxygen Therapy: Patient Spontanous Breathing and Patient connected to face mask oxygen  Post-op Assessment: Report given to RN and Post -op Vital signs reviewed and stable  Post vital signs: Reviewed and stable  Last Vitals:  Vitals Value Taken Time  BP 118/68   Temp 97.6   Pulse 66 09/04/23 1036  Resp 16 09/04/23 1036  SpO2 97 % 09/04/23 1036  Vitals shown include unfiled device data.  Last Pain:  Vitals:   09/04/23 0838  TempSrc: Oral  PainSc: 0-No pain      Patients Stated Pain Goal: 4 (09/04/23 1610)  Complications: No notable events documented.

## 2023-09-04 NOTE — Anesthesia Procedure Notes (Signed)
 Procedure Name: LMA Insertion Date/Time: 09/04/2023 9:43 AM  Performed by: Denese Mentink C, CRNAPre-anesthesia Checklist: Patient identified, Emergency Drugs available, Suction available and Patient being monitored Patient Re-evaluated:Patient Re-evaluated prior to induction Oxygen Delivery Method: Circle System Utilized Preoxygenation: Pre-oxygenation with 100% oxygen Induction Type: IV induction Ventilation: Mask ventilation without difficulty LMA: LMA inserted LMA Size: 4.0 Number of attempts: 1 Airway Equipment and Method: Bite block Placement Confirmation: positive ETCO2 Tube secured with: Tape Dental Injury: Teeth and Oropharynx as per pre-operative assessment

## 2023-09-04 NOTE — Interval H&P Note (Signed)
 History and Physical Interval Note:  09/04/2023 8:55 AM  Shelley Walker  has presented today for surgery, with the diagnosis of retained products of conception.  The various methods of treatment have been discussed with the patient and family. After consideration of risks, benefits and other options for treatment, the patient has consented to  Procedure(s): DILATION AND CURETTAGE (N/A) US  INTRAOPERATIVE (N/A) as a surgical intervention.  The patient's history has been reviewed, patient examined, no change in status, stable for surgery.  I have reviewed the patient's chart and labs.  Questions were answered to the patient's satisfaction.    Patient s/p PO dozy/zofran over the weekend with only mild bleeding today and no cramping. Denies fevers, chills. Procedure reviewed including use of US -guidance. All questions answered, proceed when ready.  Shelley Walker M Adalyn Pennock

## 2023-09-04 NOTE — Anesthesia Preprocedure Evaluation (Addendum)
 Anesthesia Evaluation  Patient identified by MRN, date of birth, ID band Patient awake    Reviewed: Allergy & Precautions, H&P , NPO status , Patient's Chart, lab work & pertinent test results  Airway Mallampati: I  TM Distance: >3 FB Neck ROM: Full    Dental  (+) Teeth Intact, Dental Advisory Given   Pulmonary neg pulmonary ROS   Pulmonary exam normal breath sounds clear to auscultation       Cardiovascular negative cardio ROS Normal cardiovascular exam Rhythm:Regular Rate:Normal     Neuro/Psych negative neurological ROS  negative psych ROS   GI/Hepatic negative GI ROS, Neg liver ROS,,,  Endo/Other  negative endocrine ROS    Renal/GU negative Renal ROS  negative genitourinary   Musculoskeletal negative musculoskeletal ROS (+)    Abdominal   Peds negative pediatric ROS (+)  Hematology  (+) Blood dyscrasia (factor V leiden)   Anesthesia Other Findings   Reproductive/Obstetrics Retained POC, delivered 08/07/23                             Anesthesia Physical Anesthesia Plan  ASA: 1  Anesthesia Plan: General   Post-op Pain Management: Tylenol PO (pre-op)* and Toradol IV (intra-op)*   Induction: Intravenous  PONV Risk Score and Plan: 4 or greater and Ondansetron, Dexamethasone, Scopolamine patch - Pre-op, Midazolam and Treatment may vary due to age or medical condition  Airway Management Planned: LMA  Additional Equipment: None  Intra-op Plan:   Post-operative Plan: Extubation in OR  Informed Consent: I have reviewed the patients History and Physical, chart, labs and discussed the procedure including the risks, benefits and alternatives for the proposed anesthesia with the patient or authorized representative who has indicated his/her understanding and acceptance.     Dental advisory given  Plan Discussed with: CRNA  Anesthesia Plan Comments:        Anesthesia Quick  Evaluation

## 2023-09-04 NOTE — Anesthesia Procedure Notes (Signed)
 Procedure Name: LMA Insertion Date/Time: 09/04/2023 9:45 AM  Performed by: Astha Probasco C, CRNAPre-anesthesia Checklist: Patient identified, Emergency Drugs available, Suction available and Patient being monitored Patient Re-evaluated:Patient Re-evaluated prior to induction Oxygen Delivery Method: Circle System Utilized Preoxygenation: Pre-oxygenation with 100% oxygen Induction Type: IV induction Ventilation: Mask ventilation without difficulty LMA: LMA inserted LMA Size: 4.0 Number of attempts: 1 Airway Equipment and Method: Bite block Placement Confirmation: positive ETCO2 Tube secured with: Tape Dental Injury: Teeth and Oropharynx as per pre-operative assessment

## 2023-09-04 NOTE — Discharge Instructions (Addendum)
 No acetaminophen/Tylenol until after 2:45pm today if needed.   No ibuprofen, Advil, Aleve, Motrin, ketorolac, meloxicam, naproxen, or other NSAIDS until after 4:00pm today if needed.    Post Anesthesia Home Care Instructions  Activity: Get plenty of rest for the remainder of the day. A responsible individual must stay with you for 24 hours following the procedure.  For the next 24 hours, DO NOT: -Drive a car -Advertising copywriter -Drink alcoholic beverages -Take any medication unless instructed by your physician -Make any legal decisions or sign important papers.  Meals: Start with liquid foods such as gelatin or soup. Progress to regular foods as tolerated. Avoid greasy, spicy, heavy foods. If nausea and/or vomiting occur, drink only clear liquids until the nausea and/or vomiting subsides. Call your physician if vomiting continues.  Special Instructions/Symptoms: Your throat may feel dry or sore from the anesthesia or the breathing tube placed in your throat during surgery. If this causes discomfort, gargle with warm salt water. The discomfort should disappear within 24 hours.  If you had a scopolamine patch placed behind your ear for the management of post- operative nausea and/or vomiting:  1. The medication in the patch is effective for 72 hours, after which it should be removed.  Wrap patch in a tissue and discard in the trash. Wash hands thoroughly with soap and water. 2. You may remove the patch earlier than 72 hours if you experience unpleasant side effects which may include dry mouth, dizziness or visual disturbances. 3. Avoid touching the patch. Wash your hands with soap and water after contact with the patch.

## 2023-09-04 NOTE — Anesthesia Postprocedure Evaluation (Signed)
 Anesthesia Post Note  Patient: Shelley Walker  Procedure(s) Performed: SUCTION DILATION AND CURETTAGE US  INTRAOPERATIVE     Patient location during evaluation: PACU Anesthesia Type: General Level of consciousness: awake and alert, oriented and patient cooperative Pain management: pain level controlled Vital Signs Assessment: post-procedure vital signs reviewed and stable Respiratory status: spontaneous breathing, nonlabored ventilation and respiratory function stable Cardiovascular status: blood pressure returned to baseline and stable Postop Assessment: no apparent nausea or vomiting Anesthetic complications: no   No notable events documented.  Last Vitals:  Vitals:   09/04/23 1036 09/04/23 1045  BP: 122/70 112/72  Pulse: 66 (!) 55  Resp: 16 16  Temp: 36.4 C   SpO2: 97% 98%    Last Pain:  Vitals:   09/04/23 1045  TempSrc:   PainSc: 0-No pain                 [name]

## 2023-09-05 ENCOUNTER — Encounter (HOSPITAL_COMMUNITY): Payer: Self-pay | Admitting: Obstetrics and Gynecology

## 2023-09-05 LAB — SURGICAL PATHOLOGY
